# Patient Record
Sex: Female | Born: 1980 | Race: Black or African American | Hispanic: No | Marital: Single | Smoking: Current every day smoker
Health system: Southern US, Community
[De-identification: ages and names within clinical notes are randomized; demographics above are authoritative.]

## PROBLEM LIST (undated history)

## (undated) DIAGNOSIS — J45909 Unspecified asthma, uncomplicated: Secondary | ICD-10-CM

## (undated) DIAGNOSIS — F4310 Post-traumatic stress disorder, unspecified: Secondary | ICD-10-CM

## (undated) DIAGNOSIS — D496 Neoplasm of unspecified behavior of brain: Secondary | ICD-10-CM

## (undated) DIAGNOSIS — F259 Schizoaffective disorder, unspecified: Secondary | ICD-10-CM

## (undated) DIAGNOSIS — R569 Unspecified convulsions: Secondary | ICD-10-CM

## (undated) DIAGNOSIS — F319 Bipolar disorder, unspecified: Secondary | ICD-10-CM

---

## 2001-08-19 ENCOUNTER — Inpatient Hospital Stay (HOSPITAL_COMMUNITY): Admission: EM | Admit: 2001-08-19 | Discharge: 2001-08-26 | Payer: Self-pay | Admitting: Psychiatry

## 2004-04-01 ENCOUNTER — Emergency Department: Payer: Self-pay | Admitting: Emergency Medicine

## 2004-06-23 ENCOUNTER — Observation Stay: Payer: Self-pay | Admitting: Certified Nurse Midwife

## 2004-08-22 ENCOUNTER — Observation Stay: Payer: Self-pay

## 2004-09-23 ENCOUNTER — Emergency Department: Payer: Self-pay | Admitting: Emergency Medicine

## 2004-09-28 ENCOUNTER — Observation Stay: Payer: Self-pay

## 2004-11-15 ENCOUNTER — Inpatient Hospital Stay: Payer: Self-pay | Admitting: Obstetrics and Gynecology

## 2005-12-12 ENCOUNTER — Ambulatory Visit: Payer: Self-pay | Admitting: Gynecology

## 2005-12-12 ENCOUNTER — Inpatient Hospital Stay (HOSPITAL_COMMUNITY): Admission: AD | Admit: 2005-12-12 | Discharge: 2005-12-14 | Payer: Self-pay | Admitting: Gynecology

## 2005-12-27 ENCOUNTER — Inpatient Hospital Stay (HOSPITAL_COMMUNITY): Admission: AD | Admit: 2005-12-27 | Discharge: 2005-12-28 | Payer: Self-pay | Admitting: Gynecology

## 2005-12-27 ENCOUNTER — Ambulatory Visit: Payer: Self-pay | Admitting: Gynecology

## 2006-01-21 ENCOUNTER — Inpatient Hospital Stay: Payer: Self-pay | Admitting: Obstetrics and Gynecology

## 2006-04-24 ENCOUNTER — Emergency Department: Payer: Self-pay | Admitting: Emergency Medicine

## 2006-12-31 ENCOUNTER — Observation Stay: Payer: Self-pay | Admitting: Obstetrics and Gynecology

## 2007-01-08 ENCOUNTER — Ambulatory Visit: Payer: Self-pay | Admitting: Obstetrics and Gynecology

## 2007-01-09 ENCOUNTER — Inpatient Hospital Stay: Payer: Self-pay | Admitting: Obstetrics and Gynecology

## 2008-12-17 ENCOUNTER — Emergency Department: Payer: Self-pay | Admitting: Emergency Medicine

## 2009-07-11 ENCOUNTER — Emergency Department: Payer: Self-pay | Admitting: Emergency Medicine

## 2014-05-09 ENCOUNTER — Emergency Department (HOSPITAL_COMMUNITY)
Admission: EM | Admit: 2014-05-09 | Discharge: 2014-05-09 | Disposition: A | Discharge status: Home / Self Care | Payer: Self-pay | Attending: Emergency Medicine | Admitting: Emergency Medicine

## 2014-05-09 ENCOUNTER — Encounter (HOSPITAL_COMMUNITY): Payer: Self-pay | Admitting: Emergency Medicine

## 2014-05-09 ENCOUNTER — Emergency Department (HOSPITAL_COMMUNITY): Payer: Self-pay

## 2014-05-09 DIAGNOSIS — R519 Headache, unspecified: Secondary | ICD-10-CM

## 2014-05-09 DIAGNOSIS — Z720 Tobacco use: Secondary | ICD-10-CM | POA: Insufficient documentation

## 2014-05-09 DIAGNOSIS — Z8659 Personal history of other mental and behavioral disorders: Secondary | ICD-10-CM | POA: Insufficient documentation

## 2014-05-09 DIAGNOSIS — J45909 Unspecified asthma, uncomplicated: Secondary | ICD-10-CM | POA: Insufficient documentation

## 2014-05-09 DIAGNOSIS — G40909 Epilepsy, unspecified, not intractable, without status epilepticus: Secondary | ICD-10-CM | POA: Insufficient documentation

## 2014-05-09 DIAGNOSIS — Z3202 Encounter for pregnancy test, result negative: Secondary | ICD-10-CM | POA: Insufficient documentation

## 2014-05-09 DIAGNOSIS — Z86011 Personal history of benign neoplasm of the brain: Secondary | ICD-10-CM | POA: Insufficient documentation

## 2014-05-09 DIAGNOSIS — R569 Unspecified convulsions: Secondary | ICD-10-CM

## 2014-05-09 DIAGNOSIS — R51 Headache: Secondary | ICD-10-CM | POA: Insufficient documentation

## 2014-05-09 DIAGNOSIS — A599 Trichomoniasis, unspecified: Secondary | ICD-10-CM | POA: Insufficient documentation

## 2014-05-09 HISTORY — DX: Unspecified asthma, uncomplicated: J45.909

## 2014-05-09 HISTORY — DX: Post-traumatic stress disorder, unspecified: F43.10

## 2014-05-09 HISTORY — DX: Neoplasm of unspecified behavior of brain: D49.6

## 2014-05-09 HISTORY — DX: Bipolar disorder, unspecified: F31.9

## 2014-05-09 HISTORY — DX: Schizoaffective disorder, unspecified: F25.9

## 2014-05-09 HISTORY — DX: Unspecified convulsions: R56.9

## 2014-05-09 LAB — CBC WITH DIFFERENTIAL/PLATELET
BASOS ABS: 0 10*3/uL (ref 0.0–0.1)
BASOS PCT: 1 % (ref 0–1)
EOS PCT: 3 % (ref 0–5)
Eosinophils Absolute: 0.2 10*3/uL (ref 0.0–0.7)
HCT: 39.6 % (ref 36.0–46.0)
HEMOGLOBIN: 13.5 g/dL (ref 12.0–15.0)
LYMPHS ABS: 2.7 10*3/uL (ref 0.7–4.0)
LYMPHS PCT: 45 % (ref 12–46)
MCH: 30.6 pg (ref 26.0–34.0)
MCHC: 34.1 g/dL (ref 30.0–36.0)
MCV: 89.8 fL (ref 78.0–100.0)
MONO ABS: 0.5 10*3/uL (ref 0.1–1.0)
Monocytes Relative: 9 % (ref 3–12)
NEUTROS ABS: 2.5 10*3/uL (ref 1.7–7.7)
Neutrophils Relative %: 42 % — ABNORMAL LOW (ref 43–77)
Platelets: 422 10*3/uL — ABNORMAL HIGH (ref 150–400)
RBC: 4.41 MIL/uL (ref 3.87–5.11)
RDW: 13.4 % (ref 11.5–15.5)
WBC: 5.9 10*3/uL (ref 4.0–10.5)

## 2014-05-09 LAB — CBG MONITORING, ED
Glucose-Capillary: 66 mg/dL — ABNORMAL LOW (ref 70–99)
Glucose-Capillary: 72 mg/dL (ref 70–99)

## 2014-05-09 LAB — URINALYSIS, ROUTINE W REFLEX MICROSCOPIC
BILIRUBIN URINE: NEGATIVE
GLUCOSE, UA: NEGATIVE mg/dL
Hgb urine dipstick: NEGATIVE
KETONES UR: NEGATIVE mg/dL
Nitrite: NEGATIVE
PH: 6.5 (ref 5.0–8.0)
PROTEIN: NEGATIVE mg/dL
SPECIFIC GRAVITY, URINE: 1.008 (ref 1.005–1.030)
UROBILINOGEN UA: 1 mg/dL (ref 0.0–1.0)

## 2014-05-09 LAB — BASIC METABOLIC PANEL
ANION GAP: 13 (ref 5–15)
BUN: 7 mg/dL (ref 6–23)
CO2: 22 mEq/L (ref 19–32)
Calcium: 9.5 mg/dL (ref 8.4–10.5)
Chloride: 101 mEq/L (ref 96–112)
Creatinine, Ser: 0.65 mg/dL (ref 0.50–1.10)
GFR calc Af Amer: >90 mL/min (ref >90)
Glucose, Bld: 81 mg/dL (ref 70–99)
Potassium: 4.1 mEq/L (ref 3.7–5.3)
Sodium: 136 mEq/L — ABNORMAL LOW (ref 137–147)

## 2014-05-09 LAB — URINE MICROSCOPIC-ADD ON

## 2014-05-09 LAB — POC URINE PREG, ED: PREG TEST UR: NEGATIVE

## 2014-05-09 MED ORDER — ONDANSETRON HCL 4 MG/2ML IJ SOLN
4.0000 mg | Freq: Once | INTRAMUSCULAR | Status: AC
  Administered 2014-05-09: 4 mg via INTRAVENOUS
  Filled 2014-05-09: qty 2

## 2014-05-09 MED ORDER — DIPHENHYDRAMINE HCL 50 MG/ML IJ SOLN
25.0000 mg | Freq: Once | INTRAMUSCULAR | Status: AC
  Administered 2014-05-09: 25 mg via INTRAVENOUS
  Filled 2014-05-09: qty 1

## 2014-05-09 MED ORDER — METOCLOPRAMIDE HCL 5 MG/ML IJ SOLN
10.0000 mg | Freq: Once | INTRAMUSCULAR | Status: AC
  Administered 2014-05-09: 10 mg via INTRAVENOUS
  Filled 2014-05-09: qty 2

## 2014-05-09 MED ORDER — LEVETIRACETAM 1000 MG PO TABS: 1000.0000 mg | ORAL_TABLET | Freq: Two times a day (BID) | ORAL | Status: AC

## 2014-05-09 MED ORDER — METRONIDAZOLE 500 MG PO TABS
2000.0000 mg | ORAL_TABLET | Freq: Once | ORAL | Status: AC
  Administered 2014-05-09: 2000 mg via ORAL
  Filled 2014-05-09: qty 4

## 2014-05-09 MED ORDER — KETOROLAC TROMETHAMINE 30 MG/ML IJ SOLN
30.0000 mg | Freq: Once | INTRAMUSCULAR | Status: AC
  Administered 2014-05-09: 30 mg via INTRAVENOUS
  Filled 2014-05-09: qty 1

## 2014-05-09 MED ORDER — LEVETIRACETAM IN NACL 1000 MG/100ML IV SOLN
1000.0000 mg | Freq: Once | INTRAVENOUS | Status: AC
  Administered 2014-05-09: 1000 mg via INTRAVENOUS
  Filled 2014-05-09: qty 100

## 2014-05-09 MED ORDER — SODIUM CHLORIDE 0.9 % IV BOLUS (SEPSIS)
1000.0000 mL | Freq: Once | INTRAVENOUS | Status: AC
  Administered 2014-05-09: 1000 mL via INTRAVENOUS
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
## 2014-05-09 NOTE — ED Notes (Signed)
Pt from home via [facility] c/o headache since yesterday. Witnessd seizure this am lasting about 1 min. HX of brain tumor last chemo treatment for tumor was in 2013. Pt alert oriented. Light sensitivity present.

## 2014-05-09 NOTE — ED Notes (Signed)
Patient transported to CT 

## 2014-05-09 NOTE — ED Notes (Signed)
MD at bedside. 

## 2014-05-09 NOTE — ED Notes (Signed)
Bed: WA25 Expected date:  Expected time:  Means of arrival:  Comments: EMS seizure 

## 2014-05-09 NOTE — Discharge Instructions (Signed)
Seizure, Adult A seizure is abnormal electrical activity in the brain. Seizures usually last from 30 seconds to 2 minutes. There are various types of seizures. Before a seizure, you may have a warning sensation (aura) that a seizure is about to occur. An aura may include the following symptoms:   Fear or anxiety.  Nausea.  Feeling like the room is spinning (vertigo).  Vision changes, such as seeing flashing lights or spots. Common symptoms during a seizure include:  A change in attention or behavior (altered mental status).  Convulsions with rhythmic jerking movements.  Drooling.  Rapid eye movements.  Grunting.  Loss of bladder and bowel control.  Bitter taste in the mouth.  Tongue biting. After a seizure, you may feel confused and sleepy. You may also have an injury resulting from convulsions during the seizure. HOME CARE INSTRUCTIONS   If you are given medicines, take them exactly as prescribed by your health care provider.  Keep all follow-up appointments as directed by your health care provider.  Do not swim or drive or engage in risky activity during which a seizure could cause further injury to you or others until your health care provider says it is OK.  Get adequate rest.  Teach friends and family what to do if you have a seizure. They should:  Lay you on the ground to prevent a fall.  Put a cushion under your head.  Loosen any tight clothing around your neck.  Turn you on your side. If vomiting occurs, this helps keep your airway clear.  Stay with you until you recover.  Know whether or not you need emergency care. SEEK IMMEDIATE MEDICAL CARE IF:  The seizure lasts longer than 5 minutes.  The seizure is severe or you do not wake up immediately after the seizure.  You have an altered mental status after the seizure.  You are having more frequent or worsening seizures. Someone should drive you to the emergency department or call local emergency  services (911 in [place] MAKE SURE YOU:  Understand these instructions.  Will watch your condition.  Will get help right away if you are not doing well or get worse. Document Released: 05/09/2000 Document Revised: 03/02/2013 Document Reviewed: 12/22/2012 [facility] Patient Information 2015 [place], [place]. This information is not intended to replace advice given to you by your health care provider. Make sure you discuss any questions you have with your health care provider.   Trichomoniasis Trichomoniasis is an infection caused by an organism called Trichomonas. The infection can affect both women and men. In women, the outer female genitalia and the vagina are affected. In men, the penis is mainly affected, but the prostate and other reproductive organs can also be involved. Trichomoniasis is a sexually transmitted infection (STI) and is most often passed to another person through sexual contact.  RISK FACTORS  Having unprotected sexual intercourse.  Having sexual intercourse with an infected partner. SIGNS AND SYMPTOMS  Symptoms of trichomoniasis in women include:  Abnormal gray-green frothy vaginal discharge.  Itching and irritation of the vagina.  Itching and irritation of the area outside the vagina. Symptoms of trichomoniasis in men include:   Penile discharge with or without pain.  Pain during urination. This results from inflammation of the urethra. DIAGNOSIS  Trichomoniasis may be found during a Pap test or physical exam. Your health care provider may use one of the following methods to help diagnose this infection:  Examining vaginal discharge under a microscope. For men, urethral discharge would be  examined.  Testing the pH of the vagina with a test tape.  Using a vaginal swab test that checks for the Trichomonas organism. A test is available that provides results within a few minutes.  Doing a culture test for the organism. This is not usually needed. TREATMENT    You may be given medicine to fight the infection. Women should inform their health care provider if they could be or are pregnant. Some medicines used to treat the infection should not be taken during pregnancy.  Your health care provider may recommend over-the-counter medicines or creams to decrease itching or irritation.  Your sexual partner will need to be treated if infected. HOME CARE INSTRUCTIONS   Take medicines only as directed by your health care provider.  Take over-the-counter medicine for itching or irritation as directed by your health care provider.  Do not have sexual intercourse while you have the infection.  Women should not douche or wear tampons while they have the infection.  Discuss your infection with your partner. Your partner may have gotten the infection from you, or you may have gotten it from your partner.  Have your sex partner get examined and treated if necessary.  Practice safe, informed, and protected sex.  See your health care provider for other STI testing. SEEK MEDICAL CARE IF:   You still have symptoms after you finish your medicine.  You develop abdominal pain.  You have pain when you urinate.  You have bleeding after sexual intercourse.  You develop a rash.  Your medicine makes you sick or makes you throw up (vomit). MAKE SURE YOU:  Understand these instructions.  Will watch your condition.  Will get help right away if you are not doing well or get worse. Document Released: 11/05/2000 Document Revised: 09/26/2013 Document Reviewed: 02/21/2013 [facility] Patient Information 2015 [place], [place]. This information is not intended to replace advice given to you by your health care provider. Make sure you discuss any questions you have with your health care provider.   General Headache Without Cause A headache is pain or discomfort felt around the head or neck area. The specific cause of a headache may not be found. There are many  causes and types of headaches. A few common ones are:  Tension headaches.  Migraine headaches.  Cluster headaches.  Chronic daily headaches. HOME CARE INSTRUCTIONS   Keep all follow-up appointments with your caregiver or any specialist referral.  Only take over-the-counter or prescription medicines for pain or discomfort as directed by your caregiver.  Lie down in a dark, quiet room when you have a headache.  Keep a headache journal to find out what may trigger your migraine headaches. For example, write down:  What you eat and drink.  How much sleep you get.  Any change to your diet or medicines.  Try massage or other relaxation techniques.  Put ice packs or heat on the head and neck. Use these 3 to 4 times per day for 15 to 20 minutes each time, or as needed.  Limit stress.  Sit up straight, and do not tense your muscles.  Quit smoking if you smoke.  Limit alcohol use.  Decrease the amount of caffeine you drink, or stop drinking caffeine.  Eat and sleep on a regular schedule.  Get 7 to 9 hours of sleep, or as recommended by your caregiver.  Keep lights dim if bright lights bother you and make your headaches worse. SEEK MEDICAL CARE IF:   You have problems with  the medicines you were prescribed.  Your medicines are not working.  You have a change from the usual headache.  You have nausea or vomiting. SEEK IMMEDIATE MEDICAL CARE IF:   Your headache becomes severe.  You have a fever.  You have a stiff neck.  You have loss of vision.  You have muscular weakness or loss of muscle control.  You start losing your balance or have trouble walking.  You feel faint or pass out.  You have severe symptoms that are different from your first symptoms. MAKE SURE YOU:   Understand these instructions.  Will watch your condition.  Will get help right away if you are not doing well or get worse. Document Released: 05/12/2005 Document Revised: 08/04/2011  Document Reviewed: 05/28/2011 [facility] Patient Information 2015 [place], [place]. This information is not intended to replace advice given to you by your health care provider. Make sure you discuss any questions you have with your health care provider.

## 2014-05-09 NOTE — ED Notes (Signed)
CSW at bedside to discuss resources.

## 2014-05-09 NOTE — ED Notes (Signed)
Initial contact-pt A&Ox4. Reports she has been taking her mother's medication-Keppra 750 mg BID. Was prescribed 1000 mg BID in the past. Unable to get prescription d/t moving from [place] and not having Medicaid at the time. Denies blurry vision at the moment. MD Regenia Skeeter at the bedside.

## 2014-05-11 LAB — GC/CHLAMYDIA PROBE AMP
CT Probe RNA: NEGATIVE
GC Probe RNA: NEGATIVE

## 2015-01-02 ENCOUNTER — Emergency Department (HOSPITAL_COMMUNITY)
Admission: EM | Admit: 2015-01-02 | Discharge: 2015-01-02 | Disposition: A | Discharge status: Home / Self Care | Payer: Self-pay | Attending: Emergency Medicine | Admitting: Emergency Medicine

## 2015-01-02 ENCOUNTER — Encounter (HOSPITAL_COMMUNITY): Payer: Self-pay

## 2015-01-02 DIAGNOSIS — Z79899 Other long term (current) drug therapy: Secondary | ICD-10-CM | POA: Insufficient documentation

## 2015-01-02 DIAGNOSIS — Y998 Other external cause status: Secondary | ICD-10-CM | POA: Insufficient documentation

## 2015-01-02 DIAGNOSIS — T63441A Toxic effect of venom of bees, accidental (unintentional), initial encounter: Secondary | ICD-10-CM

## 2015-01-02 DIAGNOSIS — Z85841 Personal history of malignant neoplasm of brain: Secondary | ICD-10-CM | POA: Insufficient documentation

## 2015-01-02 DIAGNOSIS — J45909 Unspecified asthma, uncomplicated: Secondary | ICD-10-CM | POA: Insufficient documentation

## 2015-01-02 DIAGNOSIS — Y9289 Other specified places as the place of occurrence of the external cause: Secondary | ICD-10-CM | POA: Insufficient documentation

## 2015-01-02 DIAGNOSIS — Y9389 Activity, other specified: Secondary | ICD-10-CM | POA: Insufficient documentation

## 2015-01-02 DIAGNOSIS — F319 Bipolar disorder, unspecified: Secondary | ICD-10-CM | POA: Insufficient documentation

## 2015-01-02 DIAGNOSIS — T63421A Toxic effect of venom of ants, accidental (unintentional), initial encounter: Secondary | ICD-10-CM | POA: Insufficient documentation

## 2015-01-02 DIAGNOSIS — G40909 Epilepsy, unspecified, not intractable, without status epilepticus: Secondary | ICD-10-CM | POA: Insufficient documentation

## 2015-01-02 DIAGNOSIS — Z720 Tobacco use: Secondary | ICD-10-CM | POA: Insufficient documentation

## 2015-01-02 DIAGNOSIS — R200 Anesthesia of skin: Secondary | ICD-10-CM | POA: Insufficient documentation

## 2015-01-02 MED ORDER — DIPHENHYDRAMINE HCL 25 MG PO CAPS
25.0000 mg | ORAL_CAPSULE | Freq: Once | ORAL | Status: AC
  Administered 2015-01-02: 25 mg via ORAL
  Filled 2015-01-02: qty 1

## 2015-01-02 MED ORDER — FAMOTIDINE 20 MG PO TABS
20.0000 mg | ORAL_TABLET | Freq: Once | ORAL | Status: AC
  Administered 2015-01-02: 20 mg via ORAL
  Filled 2015-01-02: qty 1

## 2015-01-02 MED ORDER — PREDNISONE 20 MG PO TABS
60.0000 mg | ORAL_TABLET | Freq: Once | ORAL | Status: AC
  Administered 2015-01-02: 60 mg via ORAL
  Filled 2015-01-02: qty 3

## 2015-01-02 MED ORDER — PREDNISONE 20 MG PO TABS: 20.0000 mg | ORAL_TABLET | Freq: Two times a day (BID) | ORAL | Status: AC

## 2015-01-02 NOTE — ED Notes (Signed)
Per EMS, Pt, from home, c/o L arm pain/numbness and L leg pain x 3 days after being stung by a bee.  Pain score 9/10.  Pt reports being stung on posterior L arm.  Pt has taken Tylenol w/o relief.  Hx of chronic L shoulder and L hip pain.

## 2015-01-02 NOTE — ED Provider Notes (Signed)
CSN: [id number]     Arrival date & time 01/02/15  1714 History   First MD Initiated Contact with Patient 01/02/15 1817     Chief Complaint  Patient presents with  . Arm Pain  . Numbness  . Leg Pain     (Consider location/radiation/quality/duration/timing/severity/associated sxs/prior Treatment) HPI\  Stacy May is a 34 y.o. female who presents for evaluation of left arm swelling after being stung by a bee. This is causing persistent pain. The sting was 3 days ago. The pain keeps her up at night. She denies shortness of breath, cough, wheezing, fever, chills, nausea or vomiting. There is no weakness. There are no other known modifying factors.   Past Medical History  Diagnosis Date  . Brain tumor   . Seizures   . PTSD (post-traumatic stress disorder)   . Bipolar 1 disorder   . Schizoaffective disorder   . Asthma    History reviewed. No pertinent past surgical history. History reviewed. No pertinent family history. Social History  Substance Use Topics  . Smoking status: Current Every Day Smoker  . Smokeless tobacco: None  . Alcohol Use: No   OB History    No data available     Review of Systems  All other systems reviewed and are negative.     Allergies  Blueberry flavor; Pineapple; and Raspberry  Home Medications   Prior to Admission medications   Medication Sig Start Date End Date Taking? Authorizing Provider  albuterol (PROVENTIL HFA;VENTOLIN HFA) 108 (90 BASE) MCG/ACT inhaler Inhale 2 puffs into the lungs every 6 (six) hours as needed for wheezing or shortness of breath.   Yes Historical Provider, MD  diphenhydramine-acetaminophen (TYLENOL PM) 25-500 MG TABS Take 1 tablet by mouth at bedtime as needed (sleep).   Yes Historical Provider, MD  levETIRAcetam (KEPPRA) 1000 MG tablet Take 1 tablet (1,000 mg total) by mouth 2 (two) times daily. 05/09/14  Yes Sherwood Gambler, MD  predniSONE (DELTASONE) 20 MG tablet Take 1 tablet (20 mg total) by mouth 2 (two) times  daily. 01/02/15   Daleen Bo, MD   BP 109/72 mmHg  Pulse 61  Temp(Src) 98.4 F (36.9 C) (Oral)  Resp 16  SpO2 100% Physical Exam  Constitutional: She is oriented to person, place, and time. She appears well-developed and well-nourished. No distress.  HENT:  Head: Normocephalic and atraumatic.  Right Ear: External ear normal.  Left Ear: External ear normal.  Eyes: Conjunctivae and EOM are normal. Pupils are equal, round, and reactive to light.  Neck: Normal range of motion and phonation normal. Neck supple.  Cardiovascular: Normal rate, regular rhythm and normal heart sounds.   Pulmonary/Chest: Effort normal and breath sounds normal. She exhibits no bony tenderness.  Abdominal: Soft. There is no tenderness.  Musculoskeletal: Normal range of motion.  Left arm, with mild swelling in the lower trapezius area. This area is edematous, boggy and tender to touch. There is normal range of motion of left shoulder, left elbow and left wrist.  Neurological: She is alert and oriented to person, place, and time. No cranial nerve deficit or sensory deficit. She exhibits normal muscle tone. Coordination normal.  Skin: Skin is warm, dry and intact.  Psychiatric: She has a normal mood and affect. Her behavior is normal. Judgment and thought content normal.  Nursing note and vitals reviewed.   ED Course  Procedures (including critical care time)  Medications  predniSONE (DELTASONE) tablet 60 mg (60 mg Oral Given 01/02/15 1928)  diphenhydrAMINE (BENADRYL) capsule  25 mg (25 mg Oral Given 01/02/15 1928)  famotidine (PEPCID) tablet 20 mg (20 mg Oral Given 01/02/15 1928)    Patient Vitals for the past 24 hrs:  BP Temp Temp src Pulse Resp SpO2  01/02/15 1917 109/72 mmHg - - 61 16 100 %  01/02/15 1718 115/72 mmHg 98.4 F (36.9 C) Oral 60 16 100 %    Findings discussed with the patient, all questions were answered.   Labs Review Labs Reviewed - No data to display  Imaging Review No results  found.   EKG Interpretation None      MDM   Final diagnoses:  Bee sting reaction, accidental or unintentional, initial encounter    Be sting with local allergic rxn.  Nursing Notes Reviewed/ Care Coordinated Applicable Imaging Reviewed Interpretation of Laboratory Data incorporated into ED treatment  The patient appears reasonably screened and/or stabilized for discharge and I doubt any other medical condition or other [facility] requiring further screening, evaluation, or treatment in the ED at this time prior to discharge.  Plan: Home Medications- Prednisone, H1/H2 blockade; Home Treatments- rest; return here if the recommended treatment, does not improve the symptoms; Recommended follow up- PCP prn     Daleen Bo, MD 01/03/15 1329

## 2015-01-02 NOTE — Discharge Instructions (Signed)
Use heat on the sore area 3 or 4 times a day. Elevate the left arm above your heart, as much as possible. The prednisone should help your pain. If you need additional pain control, use Tylenol every 4 hours. Also take Benadryl 25 mg 4 times a day, and Pepcid 20 mg twice a day until the swelling gets better.   Bee, Wasp, or Hornet Sting Your caregiver has diagnosed you as having an insect sting. An insect sting appears as a red lump in the skin that sometimes has a tiny hole in the center, or it may have a stinger in the center of the wound. The most common stings are from wasps, hornets and bees. Individuals have different reactions to insect stings.  A normal reaction may cause pain, swelling, and redness around the sting site.  A localized allergic reaction may cause swelling and redness that extends beyond the sting site.  A large local reaction may continue to develop over the next 12 to 36 hours.  On occasion, the reactions can be severe (anaphylactic reaction). An anaphylactic reaction may cause wheezing; difficulty breathing; chest pain; fainting; raised, itchy, red patches on the skin; a sick feeling to your stomach (nausea); vomiting; cramping; or diarrhea. If you have had an anaphylactic reaction to an insect sting in the past, you are more likely to have one again. HOME CARE INSTRUCTIONS   With bee stings, a small sac of poison is left in the wound. Brushing across this with something such as a credit card, or anything similar, will help remove this and decrease the amount of the reaction. This same procedure will not help a wasp sting as they do not leave behind a stinger and poison sac.  Apply a cold compress for 10 to 20 minutes every hour for 1 to 2 days, depending on severity, to reduce swelling and itching.  To lessen pain, a paste made of water and baking soda may be rubbed on the bite or sting and left on for 5 minutes.  To relieve itching and swelling, you may use take  medication or apply medicated creams or lotions as directed.  Only take over-the-counter or prescription medicines for pain, discomfort, or fever as directed by your caregiver.  Wash the sting site daily with soap and water. Apply antibiotic ointment on the sting site as directed.  If you suffered a severe reaction:  If you did not require hospitalization, an adult will need to stay with you for 24 hours in case the symptoms return.  You may need to wear a medical bracelet or necklace stating the allergy.  You and your family need to learn when and how to use an anaphylaxis kit or epinephrine injection.  If you have had a severe reaction before, always carry your anaphylaxis kit with you. SEEK MEDICAL CARE IF:   None of the above helps within 2 to 3 days.  The area becomes red, warm, tender, and swollen beyond the area of the bite or sting.  You have an oral temperature above 102 F (38.9 C). SEEK IMMEDIATE MEDICAL CARE IF:  You have symptoms of an allergic reaction which are:  Wheezing.  Difficulty breathing.  Chest pain.  Lightheadedness or fainting.  Itchy, raised, red patches on the skin.  Nausea, vomiting, cramping or diarrhea. ANY OF THESE SYMPTOMS MAY REPRESENT A SERIOUS PROBLEM THAT IS AN EMERGENCY. Do not wait to see if the symptoms will go away. Get medical help right away. Call your local emergency  services (911 in [place] DO NOT drive yourself to the hospital. MAKE SURE YOU:   Understand these instructions.  Will watch your condition.  Will get help right away if you are not doing well or get worse. Document Released: 05/12/2005 Document Revised: 08/04/2011 Document Reviewed: 10/27/2009 [facility] Patient Information 2015 [place], [place]. This information is not intended to replace advice given to you by your health care provider. Make sure you discuss any questions you have with your health care provider.

## 2015-02-15 ENCOUNTER — Emergency Department (HOSPITAL_COMMUNITY)
Admission: EM | Admit: 2015-02-15 | Discharge: 2015-02-15 | Disposition: A | Discharge status: Home / Self Care | Payer: Self-pay | Attending: Emergency Medicine | Admitting: Emergency Medicine

## 2015-02-15 ENCOUNTER — Encounter (HOSPITAL_COMMUNITY): Payer: Self-pay | Admitting: *Deleted

## 2015-02-15 DIAGNOSIS — Z8659 Personal history of other mental and behavioral disorders: Secondary | ICD-10-CM | POA: Insufficient documentation

## 2015-02-15 DIAGNOSIS — Y998 Other external cause status: Secondary | ICD-10-CM | POA: Insufficient documentation

## 2015-02-15 DIAGNOSIS — Y9289 Other specified places as the place of occurrence of the external cause: Secondary | ICD-10-CM | POA: Insufficient documentation

## 2015-02-15 DIAGNOSIS — J45909 Unspecified asthma, uncomplicated: Secondary | ICD-10-CM | POA: Insufficient documentation

## 2015-02-15 DIAGNOSIS — W57XXXA Bitten or stung by nonvenomous insect and other nonvenomous arthropods, initial encounter: Secondary | ICD-10-CM | POA: Insufficient documentation

## 2015-02-15 DIAGNOSIS — S70361A Insect bite (nonvenomous), right thigh, initial encounter: Secondary | ICD-10-CM | POA: Insufficient documentation

## 2015-02-15 DIAGNOSIS — Z86018 Personal history of other benign neoplasm: Secondary | ICD-10-CM | POA: Insufficient documentation

## 2015-02-15 DIAGNOSIS — N898 Other specified noninflammatory disorders of vagina: Secondary | ICD-10-CM

## 2015-02-15 DIAGNOSIS — Z720 Tobacco use: Secondary | ICD-10-CM | POA: Insufficient documentation

## 2015-02-15 DIAGNOSIS — L0889 Other specified local infections of the skin and subcutaneous tissue: Secondary | ICD-10-CM | POA: Insufficient documentation

## 2015-02-15 DIAGNOSIS — L089 Local infection of the skin and subcutaneous tissue, unspecified: Secondary | ICD-10-CM

## 2015-02-15 DIAGNOSIS — Y9389 Activity, other specified: Secondary | ICD-10-CM | POA: Insufficient documentation

## 2015-02-15 DIAGNOSIS — Z79899 Other long term (current) drug therapy: Secondary | ICD-10-CM | POA: Insufficient documentation

## 2015-02-15 DIAGNOSIS — G40909 Epilepsy, unspecified, not intractable, without status epilepticus: Secondary | ICD-10-CM | POA: Insufficient documentation

## 2015-02-15 DIAGNOSIS — Z3202 Encounter for pregnancy test, result negative: Secondary | ICD-10-CM | POA: Insufficient documentation

## 2015-02-15 LAB — URINALYSIS, ROUTINE W REFLEX MICROSCOPIC
Bilirubin Urine: NEGATIVE
GLUCOSE, UA: NEGATIVE mg/dL
HGB URINE DIPSTICK: NEGATIVE
KETONES UR: NEGATIVE mg/dL
Nitrite: NEGATIVE
PROTEIN: NEGATIVE mg/dL
Specific Gravity, Urine: 1.027 (ref 1.005–1.030)
Urobilinogen, UA: 1 mg/dL (ref 0.0–1.0)
pH: 6.5 (ref 5.0–8.0)

## 2015-02-15 LAB — POC URINE PREG, ED: PREG TEST UR: NEGATIVE

## 2015-02-15 LAB — URINE MICROSCOPIC-ADD ON

## 2015-02-15 LAB — WET PREP, GENITAL
TRICH WET PREP: NONE SEEN
YEAST WET PREP: NONE SEEN

## 2015-02-15 MED ORDER — DOXYCYCLINE HYCLATE 100 MG PO CAPS: 100.0000 mg | ORAL_CAPSULE | Freq: Two times a day (BID) | ORAL | Status: AC

## 2015-02-15 MED ORDER — DOXYCYCLINE HYCLATE 100 MG PO TABS
100.0000 mg | ORAL_TABLET | Freq: Once | ORAL | Status: AC
  Administered 2015-02-15: 100 mg via ORAL
  Filled 2015-02-15: qty 1

## 2015-02-15 NOTE — Discharge Instructions (Signed)
Take antibiotic for treatment of skin infection and suspect urinary tract infection.  Return to ER in 2 days if you notice no improvement of symptoms.  Cellulitis Cellulitis is an infection of the skin and the tissue beneath it. The infected area is usually red and tender. Cellulitis occurs most often in the arms and lower legs.  CAUSES  Cellulitis is caused by bacteria that enter the skin through cracks or cuts in the skin. The most common types of bacteria that cause cellulitis are staphylococci and streptococci. SIGNS AND SYMPTOMS   Redness and warmth.  Swelling.  Tenderness or pain.  Fever. DIAGNOSIS  Your health care provider can usually determine what is wrong based on a physical exam. Blood tests may also be done. TREATMENT  Treatment usually involves taking an antibiotic medicine. HOME CARE INSTRUCTIONS   Take your antibiotic medicine as directed by your health care provider. Finish the antibiotic even if you start to feel better.  Keep the infected arm or leg elevated to reduce swelling.  Apply a warm cloth to the affected area up to 4 times per day to relieve pain.  Take medicines only as directed by your health care provider.  Keep all follow-up visits as directed by your health care provider. SEEK MEDICAL CARE IF:   You notice red streaks coming from the infected area.  Your red area gets larger or turns dark in color.  Your bone or joint underneath the infected area becomes painful after the skin has healed.  Your infection returns in the same area or another area.  You notice a swollen bump in the infected area.  You develop new symptoms.  You have a fever. SEEK IMMEDIATE MEDICAL CARE IF:   You feel very sleepy.  You develop vomiting or diarrhea.  You have a general ill feeling (malaise) with muscle aches and pains. MAKE SURE YOU:   Understand these instructions.  Will watch your condition.  Will get help right away if you are not doing well or  get worse. Document Released: 02/19/2005 Document Revised: 09/26/2013 Document Reviewed: 07/28/2011 [facility] Patient Information 2015 [place], [place]. This information is not intended to replace advice given to you by your health care provider. Make sure you discuss any questions you have with your health care provider.

## 2015-02-15 NOTE — ED Notes (Signed)
PA at bedside.

## 2015-02-15 NOTE — ED Provider Notes (Signed)
CSN: [id number]     Arrival date & time 02/15/15  1523 History  This chart was scribed for non-physician practitioner Domenic Moras, PA, working with Forde Dandy, MD, by [name], ED Scribe. This patient was seen in room TR11C/TR11C and the patient's care was started at 4:51 PM.  Chief Complaint  Patient presents with  . Vaginal Discharge  . Abscess   The history is provided by the patient. No language interpreter was used.     HPI Comments: Stacy May is a 34 y.o. female who presents to the Emergency Department complaining of intermittent lower abdominal pain radiating to left flank and malodorous vaginal discharge x 3-4 days. Pt also notes dark colored urine. The pain is exacerbated with movement. Pt mentions recent change in body wash approximately 2 days ago. Pt also complains of insect bite to right upper thigh x 2 days that has since formed into an abscess. Pt tried to pop the area, causing increased redness and pain to the area. Denies dysuria, fever, chills, nausea, vomiting, or any other associated symptoms. Hx trichomoniasis. Pt reports having 2 partners within the last 6 months but states she has not used protection regularly with her partner for the past 5 months. G9P4. LNMP: 01/27/2015. She has not eaten any asparagus lately.    Past Medical History  Diagnosis Date  . Brain tumor   . Seizures   . PTSD (post-traumatic stress disorder)   . Bipolar 1 disorder   . Schizoaffective disorder   . Asthma    History reviewed. No pertinent past surgical history. History reviewed. No pertinent family history. Social History  Substance Use Topics  . Smoking status: Current Every Day Smoker  . Smokeless tobacco: None  . Alcohol Use: No   OB History    No data available     Review of Systems  Constitutional: Negative for fever and chills.  Gastrointestinal: Positive for abdominal pain. Negative for nausea and vomiting.  Genitourinary: Positive for flank pain (Left) and  vaginal discharge. Negative for dysuria.  Skin:       Abscess to right thigh   Allergies  Blueberry flavor; Pineapple; and Raspberry  Home Medications   Prior to Admission medications   Medication Sig Start Date End Date Taking? Authorizing Provider  albuterol (PROVENTIL HFA;VENTOLIN HFA) 108 (90 BASE) MCG/ACT inhaler Inhale 2 puffs into the lungs every 6 (six) hours as needed for wheezing or shortness of breath.    Historical Provider, MD  diphenhydramine-acetaminophen (TYLENOL PM) 25-500 MG TABS Take 1 tablet by mouth at bedtime as needed (sleep).    Historical Provider, MD  levETIRAcetam (KEPPRA) 1000 MG tablet Take 1 tablet (1,000 mg total) by mouth 2 (two) times daily. 05/09/14   Sherwood Gambler, MD  predniSONE (DELTASONE) 20 MG tablet Take 1 tablet (20 mg total) by mouth 2 (two) times daily. 01/02/15   Daleen Bo, MD   Triage Vitals: BP 120/70 mmHg  Pulse 70  Temp(Src) 97.9 F (36.6 C) (Oral)  Resp 20  Wt 187 lb 9.6 oz (85.095 kg)  SpO2 100%  LMP 01/27/2015   Physical Exam  Constitutional: She is oriented to person, place, and time. She appears well-developed and well-nourished. No distress.  HENT:  Head: Normocephalic and atraumatic.  Eyes: Conjunctivae and EOM are normal.  Neck: Neck supple. No tracheal deviation present.  Cardiovascular: Normal rate, regular rhythm and normal heart sounds.  Exam reveals no gallop and no friction rub.   No murmur heard. Pulmonary/Chest: Effort  normal and breath sounds normal. No respiratory distress. She has no wheezes. She has no rales. She exhibits no tenderness.  Abdominal: Soft. There is no tenderness.  No CVA tenderness  Musculoskeletal: Normal range of motion.  Neurological: She is alert and oriented to person, place, and time.  Skin: Skin is warm and dry.  Right upper anterior thigh there is an area of induration with mild fluctuance; warm to the touch, erythematous; tender to palpation  Psychiatric: She has a normal mood and  affect. Her behavior is normal.  Nursing note and vitals reviewed.   ED Course  Procedures (including critical care time)  DIAGNOSTIC STUDIES: Oxygen Saturation is 100% on RA, normal by my interpretation.    COORDINATION OF CARE: 5:09 PM-Discussed treatment plan which includes pelvic exam, urine pregnancy, UA, genital wet prep, RPR, HIV antibody with pt at bedside and pt agreed to plan.   7:26 PM Pt with an infected insect bite to R thigh, evidence of cellulitis with questionable early forming abscess.  I offer I&D but pt declined.  She also report vaginal discomfort and mild urinary discomfort.  Pelvic examination is unremarkable.  UA showing questionable UTI.  Wet prep unremarkable.  Will prescribe doxy as treatment.  Return precaution discussed.   Labs Review Labs Reviewed  WET PREP, GENITAL - Abnormal; Notable for the following:    Clue Cells Wet Prep HPF POC FEW (*)    WBC, Wet Prep HPF POC FEW (*)    All other components within normal limits  URINALYSIS, ROUTINE W REFLEX MICROSCOPIC (NOT AT [facility]) - Abnormal; Notable for the following:    APPearance TURBID (*)    Leukocytes, UA SMALL (*)    All other components within normal limits  URINE MICROSCOPIC-ADD ON - Abnormal; Notable for the following:    Squamous Epithelial / LPF MANY (*)    Bacteria, UA MANY (*)    All other components within normal limits  RPR  HIV ANTIBODY (ROUTINE TESTING)  POC URINE PREG, ED  GC/CHLAMYDIA PROBE AMP ([place]) NOT AT [facility]      MDM   Final diagnoses:  Infected insect bite of right thigh, initial encounter  Vaginal discharge   BP 125/79 mmHg  Pulse 68  Temp(Src) 98 F (36.7 C) (Oral)  Resp 18  Wt 187 lb 9.6 oz (85.095 kg)  SpO2 100%  LMP 01/27/2015   I personally performed the services described in this documentation, which was scribed in my presence. The recorded information has been reviewed and is accurate.        Domenic Moras, PA-C 02/15/15 [place],  MD 02/16/15 [phone]

## 2015-02-15 NOTE — ED Notes (Signed)
Pt in c/o lower abd pain with vaginal discharge for the last few days, also reports abscess to right upper thigh that she would like evaluated, no distress noted

## 2015-02-16 LAB — GC/CHLAMYDIA PROBE AMP (~~LOC~~) NOT AT ARMC
Chlamydia: NEGATIVE
NEISSERIA GONORRHEA: NEGATIVE

## 2015-02-16 LAB — RPR: RPR Ser Ql: NONREACTIVE

## 2015-02-16 LAB — HIV ANTIBODY (ROUTINE TESTING W REFLEX): HIV SCREEN 4TH GENERATION: NONREACTIVE

## 2015-02-17 ENCOUNTER — Encounter (HOSPITAL_COMMUNITY): Payer: Self-pay | Admitting: *Deleted

## 2015-02-17 ENCOUNTER — Emergency Department (HOSPITAL_COMMUNITY)
Admission: EM | Admit: 2015-02-17 | Discharge: 2015-02-17 | Disposition: A | Discharge status: Home / Self Care | Payer: Self-pay | Attending: Emergency Medicine | Admitting: Emergency Medicine

## 2015-02-17 DIAGNOSIS — J45909 Unspecified asthma, uncomplicated: Secondary | ICD-10-CM | POA: Insufficient documentation

## 2015-02-17 DIAGNOSIS — Z79899 Other long term (current) drug therapy: Secondary | ICD-10-CM | POA: Insufficient documentation

## 2015-02-17 DIAGNOSIS — G40909 Epilepsy, unspecified, not intractable, without status epilepticus: Secondary | ICD-10-CM | POA: Insufficient documentation

## 2015-02-17 DIAGNOSIS — Z720 Tobacco use: Secondary | ICD-10-CM | POA: Insufficient documentation

## 2015-02-17 DIAGNOSIS — Z7952 Long term (current) use of systemic steroids: Secondary | ICD-10-CM | POA: Insufficient documentation

## 2015-02-17 DIAGNOSIS — F319 Bipolar disorder, unspecified: Secondary | ICD-10-CM | POA: Insufficient documentation

## 2015-02-17 DIAGNOSIS — Z87828 Personal history of other (healed) physical injury and trauma: Secondary | ICD-10-CM | POA: Insufficient documentation

## 2015-02-17 DIAGNOSIS — L02415 Cutaneous abscess of right lower limb: Secondary | ICD-10-CM | POA: Insufficient documentation

## 2015-02-17 DIAGNOSIS — Z86011 Personal history of benign neoplasm of the brain: Secondary | ICD-10-CM | POA: Insufficient documentation

## 2015-02-17 DIAGNOSIS — Z792 Long term (current) use of antibiotics: Secondary | ICD-10-CM | POA: Insufficient documentation

## 2015-02-17 MED ORDER — HYDROCODONE-ACETAMINOPHEN 5-325 MG PO TABS: 1.0000 | ORAL_TABLET | Freq: Four times a day (QID) | ORAL | Status: AC | PRN

## 2015-02-17 MED ORDER — TETANUS-DIPHTH-ACELL PERTUSSIS 5-2.5-18.5 LF-MCG/0.5 IM SUSP
0.5000 mL | Freq: Once | INTRAMUSCULAR | Status: AC
  Administered 2015-02-17: 0.5 mL via INTRAMUSCULAR
  Filled 2015-02-17: qty 0.5

## 2015-02-17 MED ORDER — LIDOCAINE-EPINEPHRINE (PF) 2 %-1:200000 IJ SOLN
10.0000 mL | Freq: Once | INTRAMUSCULAR | Status: AC
  Administered 2015-02-17: 10 mL via INTRADERMAL
  Filled 2015-02-17: qty 20

## 2015-02-17 NOTE — ED Provider Notes (Signed)
CSN: [id number]     Arrival date & time 02/17/15  1231 History   First MD Initiated Contact with Patient 02/17/15 1406     Chief Complaint  Patient presents with  . Abscess     (Consider location/radiation/quality/duration/timing/severity/associated sxs/prior Treatment) HPI   34 year old female presents for evaluation of right thigh pain. Patient believes she may have been bitten by an insect to her right anterior thigh approximately 4 days ago. She was seen 2 days ago for vaginal discharge and right thigh pain. She had a pelvic examination and was diagnosed with cellulitis of R thigh, potential early forming abscess. I offer I&D procedure initially however patient declined. Patient was discharged with antibiotic. She has not taking antibiotic and reports that her pain has increasing in intensity. Patient's tetanus is not up-to-date. Otherwise she denies having fever, increased abdominal pain, numbness or weakness.  Past Medical History  Diagnosis Date  . Brain tumor   . Seizures   . PTSD (post-traumatic stress disorder)   . Bipolar 1 disorder   . Schizoaffective disorder   . Asthma    History reviewed. No pertinent past surgical history. History reviewed. No pertinent family history. Social History  Substance Use Topics  . Smoking status: Current Every Day Smoker  . Smokeless tobacco: None  . Alcohol Use: No   OB History    No data available     Review of Systems  Constitutional: Negative for fever.  Skin: Positive for rash.  Neurological: Negative for numbness.      Allergies  Blueberry flavor; Pineapple; and Raspberry  Home Medications   Prior to Admission medications   Medication Sig Start Date End Date Taking? Authorizing Provider  albuterol (PROVENTIL HFA;VENTOLIN HFA) 108 (90 BASE) MCG/ACT inhaler Inhale 2 puffs into the lungs every 6 (six) hours as needed for wheezing or shortness of breath.    Historical Provider, MD  diphenhydramine-acetaminophen (TYLENOL  PM) 25-500 MG TABS Take 1 tablet by mouth at bedtime as needed (sleep).    Historical Provider, MD  doxycycline (VIBRAMYCIN) 100 MG capsule Take 1 capsule (100 mg total) by mouth 2 (two) times daily. One po bid x 7 days 02/15/15   Domenic Moras, PA-C  levETIRAcetam (KEPPRA) 1000 MG tablet Take 1 tablet (1,000 mg total) by mouth 2 (two) times daily. 05/09/14   Sherwood Gambler, MD  predniSONE (DELTASONE) 20 MG tablet Take 1 tablet (20 mg total) by mouth 2 (two) times daily. 01/02/15   Daleen Bo, MD   BP 117/53 mmHg  Pulse 76  Temp(Src) 98.3 F (36.8 C) (Oral)  Resp 18  Ht 5' 4.5" (1.638 m)  Wt 187 lb (84.823 kg)  BMI 31.61 kg/m2  SpO2 100%  LMP 01/27/2015 Physical Exam  Constitutional: She appears well-developed and well-nourished. No distress.  HENT:  Head: Atraumatic.  Eyes: Conjunctivae are normal.  Neck: Neck supple.  Neurological: She is alert.  Skin: Rash (Right anterior thigh: An area of erythema measuring 6 cm in diameter with an overlying scab, moderate tenderness to palpation. Mild induration without any significant fluctuant.) noted.  Psychiatric: She has a normal mood and affect.  Nursing note and vitals reviewed.   ED Course  Procedures (including critical care time)  Patient presents with an infected insect bite, perhaps early forming abscess. Will perform I&D. Tetanus shot given.  INCISION AND DRAINAGE Performed by: Domenic Moras, with PA student Etheleen Nicks Consent: Verbal consent obtained. Risks and benefits: risks, benefits and alternatives were discussed Type: abscess  Body area: R  anterior thigh  Anesthesia: local infiltration  Incision was made with a scalpel.  Local anesthetic: lidocaine 2% w epinephrine  Anesthetic total: 3 ml  Complexity: complex Blunt dissection to break up loculations  Drainage: purulent  Drainage amount: moderate  Packing material: 1/4 in iodoform gauze  Patient tolerance: Patient tolerated the procedure well with no  immediate complications.    MDM   Final diagnoses:  Abscess of right thigh    BP 117/53 mmHg  Pulse 76  Temp(Src) 98.3 F (36.8 C) (Oral)  Resp 18  Ht 5' 4.5" (1.638 m)  Wt 187 lb (84.823 kg)  BMI 31.61 kg/m2  SpO2 100%  LMP 01/27/2015     Domenic Moras, PA-C 02/17/15 [place], MD 02/17/15 [phone]

## 2015-02-17 NOTE — Discharge Instructions (Signed)
Follow up at Urgent Care in 2 days to have your wound recheck and packing removed.  Take antibiotic as prescribed and take pain medication as needed.    Abscess Care After An abscess (also called a boil or furuncle) is an infected area that contains a collection of pus. Signs and symptoms of an abscess include pain, tenderness, redness, or hardness, or you may feel a moveable soft area under your skin. An abscess can occur anywhere in the body. The infection may spread to surrounding tissues causing cellulitis. A cut (incision) by the surgeon was made over your abscess and the pus was drained out. Gauze may have been packed into the space to provide a drain that will allow the cavity to heal from the inside outwards. The boil may be painful for 5 to 7 days. Most people with a boil do not have high fevers. Your abscess, if seen early, may not have localized, and may not have been lanced. If not, another appointment may be required for this if it does not get better on its own or with medications. HOME CARE INSTRUCTIONS   Only take over-the-counter or prescription medicines for pain, discomfort, or fever as directed by your caregiver.  When you bathe, soak and then remove gauze or iodoform packs at least daily or as directed by your caregiver. You may then wash the wound gently with mild soapy water. Repack with gauze or do as your caregiver directs. SEEK IMMEDIATE MEDICAL CARE IF:   You develop increased pain, swelling, redness, drainage, or bleeding in the wound site.  You develop signs of generalized infection including muscle aches, chills, fever, or a general ill feeling.  An oral temperature above 102 F (38.9 C) develops, not controlled by medication. See your caregiver for a recheck if you develop any of the symptoms described above. If medications (antibiotics) were prescribed, take them as directed. Document Released: 11/28/2004 Document Revised: 08/04/2011 Document Reviewed:  07/26/2007 [facility] Patient Information 2015 [place], [place]. This information is not intended to replace advice given to you by your health care provider. Make sure you discuss any questions you have with your health care provider.

## 2015-02-17 NOTE — ED Notes (Signed)
Pt seen here on Thursday for same. Has possible insect bite/abscess to right thigh that has gotten worse. Has not taken antibiotics as prescribed.

## 2015-02-21 ENCOUNTER — Emergency Department (HOSPITAL_COMMUNITY)
Admission: EM | Admit: 2015-02-21 | Discharge: 2015-02-21 | Disposition: A | Discharge status: Home / Self Care | Payer: Self-pay | Attending: Emergency Medicine | Admitting: Emergency Medicine

## 2015-02-21 DIAGNOSIS — Z4801 Encounter for change or removal of surgical wound dressing: Secondary | ICD-10-CM | POA: Insufficient documentation

## 2015-02-21 DIAGNOSIS — J45909 Unspecified asthma, uncomplicated: Secondary | ICD-10-CM | POA: Insufficient documentation

## 2015-02-21 DIAGNOSIS — F319 Bipolar disorder, unspecified: Secondary | ICD-10-CM | POA: Insufficient documentation

## 2015-02-21 DIAGNOSIS — Z720 Tobacco use: Secondary | ICD-10-CM | POA: Insufficient documentation

## 2015-02-21 DIAGNOSIS — G40909 Epilepsy, unspecified, not intractable, without status epilepticus: Secondary | ICD-10-CM | POA: Insufficient documentation

## 2015-02-21 DIAGNOSIS — Z09 Encounter for follow-up examination after completed treatment for conditions other than malignant neoplasm: Secondary | ICD-10-CM

## 2015-02-21 DIAGNOSIS — Z79899 Other long term (current) drug therapy: Secondary | ICD-10-CM | POA: Insufficient documentation

## 2015-02-21 NOTE — ED Notes (Signed)
Pt is in stable condition upon d/c and ambulates from ED. 

## 2015-02-21 NOTE — ED Notes (Addendum)
Pt returns to have packing removed from right leg. Pt also st's she was given Rx for antibiotic when she was here for vag. Discharge.  Pt st's it's too expensive and needs a cheaper antibiotic

## 2015-02-21 NOTE — ED Provider Notes (Signed)
CSN: [id number]     Arrival date & time 02/21/15  1448 History  By signing my name below, I, Stacy May, attest that this documentation has been prepared under the direction and in the presence of [employer], PA-C. Electronically Signed: Eustaquio May, ED Scribe. 02/21/2015. 4:10 PM.  Chief Complaint  Patient presents with  . Wound Check   The history is provided by the patient. No language interpreter was used.     HPI Comments: Stacy May is a 34 y.o. female who presents to the Emergency Department for wound check. Pt was seen in the ED on 02/17/2015 (approximately 4 days ago) for abscess to right thigh for which she had an I&D and packing done. Pt was told to come back to the ED In 4 days to have packing removed. She states that the wound has been healing well. Denies fever, chills, or any other associated symptoms.   Past Medical History  Diagnosis Date  . Brain tumor   . Seizures   . PTSD (post-traumatic stress disorder)   . Bipolar 1 disorder   . Schizoaffective disorder   . Asthma    No past surgical history on file. No family history on file. Social History  Substance Use Topics  . Smoking status: Current Every Day Smoker  . Smokeless tobacco: Not on file  . Alcohol Use: No   OB History    No data available     Review of Systems  Constitutional: Negative for fever and chills.  Skin: Positive for wound (Abscess to right thigh).   Allergies  Blueberry flavor; Pineapple; and Raspberry  Home Medications   Prior to Admission medications   Medication Sig Start Date End Date Taking? Authorizing Provider  albuterol (PROVENTIL HFA;VENTOLIN HFA) 108 (90 BASE) MCG/ACT inhaler Inhale 2 puffs into the lungs every 6 (six) hours as needed for wheezing or shortness of breath.    Historical Provider, MD  diphenhydramine-acetaminophen (TYLENOL PM) 25-500 MG TABS Take 1 tablet by mouth at bedtime as needed (sleep).    Historical Provider, MD  doxycycline  (VIBRAMYCIN) 100 MG capsule Take 1 capsule (100 mg total) by mouth 2 (two) times daily. One po bid x 7 days 02/15/15   Domenic Moras, PA-C  HYDROcodone-acetaminophen (NORCO/VICODIN) 5-325 MG per tablet Take 1 tablet by mouth every 6 (six) hours as needed for moderate pain. 02/17/15   Domenic Moras, PA-C  levETIRAcetam (KEPPRA) 1000 MG tablet Take 1 tablet (1,000 mg total) by mouth 2 (two) times daily. 05/09/14   Sherwood Gambler, MD  predniSONE (DELTASONE) 20 MG tablet Take 1 tablet (20 mg total) by mouth 2 (two) times daily. 01/02/15   Daleen Bo, MD   Triage Vitals: BP 113/71 mmHg  Pulse 72  Temp(Src) 98.1 F (36.7 C) (Oral)  Resp 16  SpO2 99%  LMP 01/27/2015   Physical Exam  Constitutional: She is oriented to person, place, and time. She appears well-developed and well-nourished. No distress.  HENT:  Head: Normocephalic and atraumatic.  Eyes: Conjunctivae and EOM are normal.  Neck: Neck supple. No tracheal deviation present.  Cardiovascular: Normal rate.   Pulmonary/Chest: Effort normal. No respiratory distress.  Musculoskeletal: Normal range of motion.  Neurological: She is alert and oriented to person, place, and time.  Skin: Skin is warm and dry.  Right thigh abscess that is healing well without surrounding redness or drainage Packing was removed by myself There is a 2 cm deep hole that I will repack  Psychiatric: She has a  normal mood and affect. Her behavior is normal.  Nursing note and vitals reviewed.   ED Course  Wound packing Date/Time: 02/21/2015 5:46 PM Performed by: Ottie Glazier Authorized by: Ottie Glazier Consent: Verbal consent obtained. Written consent obtained. Risks and benefits: risks, benefits and alternatives were discussed Consent given by: patient Patient understanding: patient states understanding of the procedure being performed Patient consent: the patient's understanding of the procedure matches consent given Patient identity confirmed: verbally  with patient Preparation: Patient was prepped and draped in the usual sterile fashion. Local anesthesia used: no Patient sedated: no Patient tolerance: Patient tolerated the procedure well with no immediate complications Comments: 1/4 iodoform packing was used to pack the right thigh.     (including critical care time)  DIAGNOSTIC STUDIES: Oxygen Saturation is 99% on RA, normal by my interpretation.    COORDINATION OF CARE: 4:09 PM-Discussed treatment plan which includes packing removal and repacking abscess with pt at bedside and pt agreed to plan.   Labs Review Labs Reviewed - No data to display  Imaging Review No results found.    EKG Interpretation None      MDM   Final diagnoses:  Encounter for recheck of abscess following incision and drainage  Patient presents for abscess recheck. She is afebrile well-appearing. The abscess appears to be healing well but was repacked today. No signs of infection. I discussed removing the packing in 48 hours and having the wound heal on its own. I discussed return precautions with the patient as well as follow-up and she verbally agrees with the plan.  I personally performed the services described in this documentation, which was scribed in my presence. The recorded information has been reviewed and is accurate.      Ottie Glazier, PA-C 02/21/15 1848  Wandra Arthurs, MD 02/22/15 [phone]

## 2015-02-21 NOTE — Discharge Instructions (Signed)
Wound Check Follow-up with a primary care provider using the resource guide below. The wound appears to be healing well today. Remove the packing in 48 hours and let the wound heal on its own. Return for any fever, surrounding redness, or drainage. Your wound appears healthy today. Your wound will heal gradually over time. Eventually a scar will form that will fade with time. FACTORS THAT AFFECT SCAR FORMATION:  People differ in the severity in which they scar.  Scar severity varies according to location, size, and the traits you inherited from your parents (genetic predisposition).  Irritation to the wound from infection, rubbing, or chemical exposure will increase the amount of scar formation. HOME CARE INSTRUCTIONS   If you were given a dressing, you should change it at least once a day or as instructed by your caregiver. If the bandage sticks, soak it off with a solution of hydrogen peroxide.  If the bandage becomes wet, dirty, or develops a bad smell, change it as soon as possible.  Look for signs of infection.  Only take over-the-counter or prescription medicines for pain, discomfort, or fever as directed by your caregiver. SEEK IMMEDIATE MEDICAL CARE IF:   You have redness, swelling, or increasing pain in the wound.  You notice pus coming from the wound.  You have a fever.  You notice a bad smell coming from the wound or dressing. Document Released: 02/16/2004 Document Revised: 08/04/2011 Document Reviewed: 05/12/2005 [facility] Patient Information 2015 [place], [place]. This information is not intended to replace advice given to you by your health care provider. Make sure you discuss any questions you have with your health care provider.  Emergency Department Resource Guide 1) Find a Doctor and Pay Out of Pocket Although you won't have to find out who is covered by your insurance plan, it is a good idea to ask around and get recommendations. You will then need to call the  office and see if the doctor you have chosen will accept you as a new patient and what types of options they offer for patients who are self-pay. Some doctors offer discounts or will set up payment plans for their patients who do not have insurance, but you will need to ask so you aren't surprised when you get to your appointment.  2) Contact Your Local Health Department Not all health departments have doctors that can see patients for sick visits, but many do, so it is worth a call to see if yours does. If you don't know where your local health department is, you can check in your phone book. The CDC also has a tool to help you locate your state's health department, and many state websites also have listings of all of their local health departments.  3) Find a [facility] If your illness is not likely to be very severe or complicated, you may want to try a walk in clinic. These are popping up all over the country in pharmacies, drugstores, and shopping centers. They're usually staffed by nurse practitioners or physician assistants that have been trained to treat common illnesses and complaints. They're usually fairly quick and inexpensive. However, if you have serious medical issues or chronic medical problems, these are probably not your best option.  No Primary Care Doctor: - Call Health Connect at  [phone] - they can help you locate a primary care doctor that  accepts your insurance, provides certain services, etc. - Physician Referral Service- [phone]  Chronic Pain Problems: Organization  Address  Phone   Notes  °[facility]  (336) 297-2271 Patients need to be referred by their primary care doctor.  ° °Medication Assistance: °Organization         Address  Phone   Notes  °[place] [facility] [address] °[place], [place] [address] (336) 641-8030 --Must be a resident of [place] °-- Must have NO insurance coverage  whatsoever (no Medicaid/ Medicare, etc.) °-- The pt. MUST have a primary care doctor that directs their care regularly and follows them in the community °  °[facility]  (866) 331-1348   °[employer]  (888) 892-1162   ° °Agencies that provide inexpensive medical care: °Organization         Address  Phone   Notes  °[facility]  (336) 832-8035   °[facility]    (336) 832-7272   °[facility] [address] °[place], [place] [address] (336) 832-4777   °[facility] [address], °[place] (336) 271-4999   °[facility]    (336) 373-0678   °[facility]    (336) 272-1050   °[facility] ° [address], [place] Phone:  (336) 832-4444, Fax:  (336) 832-4440 Hours of Operation:  9 am - 6 pm, M-F.  Also accepts Medicaid/Medicare and self-pay.  °[facility] ° [address], [place] Phone: (336) 832-3150, Fax: (336) 832-3151. Hours of Operation:  8:30 am - 5:30 pm, M-F.  Also accepts Medicaid and self-pay.  °[facility] [place] [address], [place] Phone: (336) 878-6027   °[facility] [address], [place], [place] (336)723-1848, Ext. 123 Mondays & Thursdays: 7-9 AM.  First 15 patients are seen on a first come, first serve basis. °  ° °Medicaid-accepting [place] Providers: ° °Organization         Address  Phone   Notes  °[facility] [address], [place] (336) 641-2100 Also accepts self-pay patients.  °[facility] [address], [place] ° (336) 856-9996   °[facility] [address], [place] (336) 288-8857   °[facility] [address] [place] [address], [place] (336) 299-7000   °Veita Bland [address], [place]  ° (336) 373-1557 Only accepts [place] Access Medicaid patients after they have their name applied  to their card.  ° °Self-Pay (no insurance) in [place]: ° °Organization         Address  Phone   Notes  °Sickle Cell Patients, [facility] [address], [place] (336) 832-1970   °[facility] [address], [place] (336) 832-4400   °[facility] ° [address],  (336) 992-4800   °[facility]/Dr. Osei-Bonsu ° [address] [place] [address], [place] or [address], [place] (336) 841-8500 Phone number for both [place] and [place] locations is the same.  °[facility] [address], [place] (336) 299-0000   °[facility] [address] [place] [address], [place] or [address] (336) 852-7530 °(336) 878-2260   °[facility] [address],  (336) 350-1642, phone; (336) 294-5005, fax Sees patients 1st and 3rd Saturday of every month.  Must not qualify   for public or private insurance (i.e. Medicaid, Medicare, [place] Health Choice, Veterans' Benefits) • Household income should be no more than 200% of the poverty level •The clinic cannot treat you if you are pregnant or think you are pregnant • Sexually transmitted diseases are not treated at the clinic.  ° ° °Dental Care: °Organization         Address  Phone  Notes  °[facility] [address], [place] (336) 641-6152 Accepts children up to age 21 who are enrolled in Medicaid or [place] Health Choice; pregnant women with a Medicaid card; and children who have applied for Medicaid or [place] Health Choice, but were declined, whose parents can pay a reduced fee at time of service.  °[facility] [place]  [address], [place] (336) 641-7733 Accepts children up to age 21 who are enrolled in Medicaid or [place] Health Choice; pregnant women with a Medicaid card; and children who have applied for Medicaid or [place]  Health Choice, but were declined, whose parents can pay a reduced fee at time of service.  °[facility] ° [address], [place] (336) 641-4533 Patients are seen by appointment only. Walk-ins are not accepted. [facility] will see patients 18 years of age and older. °Monday - Tuesday (8am-5pm) °Most Wednesdays (8:30-5pm) °$30 per visit, cash only  °[facility] ° [address], [place] (336) 641-4533 Patients are seen by appointment only. Walk-ins are not accepted. [facility] will see patients 18 years of age and older. °One Wednesday Evening (Monthly: Volunteer Based).  $30 per visit, cash only  °[facility]  (919) 537-3737 for adults; Children under age 4, call Graduate Pediatric Dentistry at ([phone]) [phone]. Children aged 4-14, please call (919) [phone] to request a pediatric application. ° Dental services are provided in all areas of dental care including fillings, crowns and bridges, complete and partial dentures, implants, gum treatment, root canals, and extractions. Preventive care is also provided. Treatment is provided to both adults and children. °Patients are selected via a lottery and there is often a waiting list. °  °[facility] [address], °[place] ° (336) 763-8833 [email or web address] °  °[facility] [address], [place], [place] (336)723-1848, Ext. 123 Second and Fourth Thursday of each month, opens at 6:30 AM; Clinic ends at 9 AM.  Patients are seen on a first-come first-served basis, and a limited number are seen during each clinic.  ° °[facility] ° [address], [place], [place] (336) 723-7904   Eligibility Requirements °You must have lived in [place], [place], or [place] counties for at least the last three months. °  You cannot be eligible for state or federal sponsored healthcare insurance, including Veterans Administration, Medicaid, or Medicare. °   You generally cannot be eligible for healthcare insurance through your employer.  °  How to apply: °Eligibility screenings are held every Tuesday and Wednesday afternoon from 1:00 pm until 4:00 pm. You do not need an appointment for the interview!  °[facility] [address], [place], [place] 336-631-2330   °[facility]  336-342-8273   °[facility]  336-703-3100   °[facility]  336-570-6415   ° °Behavioral Health Resources in the Community: °Intensive Outpatient Programs °Organization         Address  Phone    Notes  °[facility] [address], [place], [place] 336-878-6098   °[facility] [address], [place], [place] 336-832-9800   °[facility] [address], [place], [place] ° 336-882-2125   °[facility] [address],  °[place], [place] 1-800-853-5163 or 336-641-4981   °Substance Abuse Resources °Organization         Address  Phone  Notes  °[facility]  336-882-2125   °[facility]  336-784-9470   °[facility]  336-285-9073   °[facility]  336-845-3988   °Residential & Outpatient Substance Abuse Program  1-800-659-3381   °Psychological Services °Organization         Address  Phone  Notes  °[facility]  336- 832-9600   °[facility]  336- 378-7881   °[facility] [address], [place] 1-800-853-5163 or 336-641-4981   ° °Mobile Crisis Teams °Organization         Address  Phone  Notes  °[facility], Mobile Crisis Care Unit  1-877-626-1772   °Assertive °[facility] ° [address] [place], [place] 336-834-9664   °Sharon DeEsch [address] °[place] 336-554-5454   ° °Self-Help/Support Groups °Organization         Address  Phone             Notes  °[facility] - variety of support groups  336- 373-1402 Call  for more information  °Narcotics Anonymous (NA), [facility] [address], °[place]  2 meetings at this location  ° °Residential Treatment Programs °Organization         Address  Phone  Notes  °[facility] [address],    °[place]  1-866-801-8205   °[facility] ° [address], [place], [place] 704-293-8524   °[facility] [address], [place] 336-845-3988 Admissions: 8am-3pm M-F  °[facility] [address]    °[place], [place] 336-841-1104   °[facility] [address], [place], [place] 336-379-7146   °[facility] [address]  °[place], [place] 336-285-9073   °[facility] Intensive Outpatient [address], [place], [place] 336-852-3033   °[facility] [address]  °[place], [place] 1-877-615-2722 or 336-784-9470   °[facility] [address] [place], [place] 336-227-7417 Accepts Medicaid  °[facility] [address]  ° [place] 1-800-659-3381 Substance Abuse/Addiction Treatment  ° °[place] Behavioral Health Resources °Organization         Address  Phone  Notes  °[facility]  (888) 581-9988   °Julie Brannon, PhD [address] [place], [place]   (336) 349-5553 or (336) 951-0000   °[facility]   [address] °[place], [place] (336) 349-4454   °[facility] [address], [place], [place] (336) 342-8316 Insurance/Medicaid/sponsorship through [facility]  °[facility] [address]                                    [place], [place] (336) 342-8316 Therapy/tele-psych/case  °[facility] [address]  ° [place], [place] (336) 349-2233    °Dr. Arfeen  (336) 349-4544   °[facility] [place]  [employer] [place]   [facility] 1) [address], [place] 2) [place] 3)  [address], [place]  [phone]) [phone]  [phone]   [place] [phone] or ([phone] (After Hours)

## 2015-02-21 NOTE — ED Notes (Signed)
Pt reports to the ED for eval of packing removal to right upper thigh. She was seen 2 days ago and had the area lanced and packing placed and was told to come here in 2 days for removal. Pt denies any fevers, chills, or increased erythema or warmth. Pt A&Ox4, resp e/u, and skin warm and dry.

## 2015-03-05 ENCOUNTER — Emergency Department (HOSPITAL_COMMUNITY)
Admission: EM | Admit: 2015-03-05 | Discharge: 2015-03-05 | Disposition: A | Discharge status: Home / Self Care | Payer: Self-pay | Attending: Emergency Medicine | Admitting: Emergency Medicine

## 2015-03-05 ENCOUNTER — Encounter (HOSPITAL_COMMUNITY): Payer: Self-pay | Admitting: Emergency Medicine

## 2015-03-05 DIAGNOSIS — Z720 Tobacco use: Secondary | ICD-10-CM | POA: Insufficient documentation

## 2015-03-05 DIAGNOSIS — R569 Unspecified convulsions: Secondary | ICD-10-CM

## 2015-03-05 DIAGNOSIS — G40909 Epilepsy, unspecified, not intractable, without status epilepticus: Secondary | ICD-10-CM | POA: Insufficient documentation

## 2015-03-05 DIAGNOSIS — Z8659 Personal history of other mental and behavioral disorders: Secondary | ICD-10-CM | POA: Insufficient documentation

## 2015-03-05 DIAGNOSIS — L97822 Non-pressure chronic ulcer of other part of left lower leg with fat layer exposed: Secondary | ICD-10-CM | POA: Insufficient documentation

## 2015-03-05 DIAGNOSIS — Z79899 Other long term (current) drug therapy: Secondary | ICD-10-CM | POA: Insufficient documentation

## 2015-03-05 DIAGNOSIS — L97922 Non-pressure chronic ulcer of unspecified part of left lower leg with fat layer exposed: Secondary | ICD-10-CM

## 2015-03-05 DIAGNOSIS — J45909 Unspecified asthma, uncomplicated: Secondary | ICD-10-CM | POA: Insufficient documentation

## 2015-03-05 MED ORDER — BACITRACIN ZINC 500 UNIT/GM EX OINT
1.0000 "application " | TOPICAL_OINTMENT | Freq: Two times a day (BID) | CUTANEOUS | Status: DC
  Administered 2015-03-05: 1 via TOPICAL

## 2015-03-05 NOTE — Discharge Instructions (Signed)
As discussed, with your history of seizures, as well as new episodes of skin lesions is very important that you follow-up with our primary care center. The health center takes walk-in patients starting at 9 AM on weekdays. Please be sure to follow-up tomorrow to ensure appropriate ongoing care.

## 2015-03-05 NOTE — ED Provider Notes (Signed)
CSN: [id number]     Arrival date & time 03/05/15  2101 History   First MD Initiated Contact with Patient 03/05/15 2126     Chief Complaint  Patient presents with  . Seizures     (Consider location/radiation/quality/duration/timing/severity/associated sxs/prior Treatment) HPI Patient presents after a seizure today, but with no current complaints beyond nonhealing ulceration on the left lower extremity. Patient acknowledges a history of seizures, ADHD, states that she has taken no medication in 3 weeks, and prior to that was only using her mother's medication for seizure control. Patient previously was on [place]. Today, vision probably had a seizure was witnessed by bystanders. EMS reports the patient was postictal on arrival, but returned to baseline interactivity and transport. Patient complains of no access to primary care, no ability to fill prescriptions.  She denies confusion, disorientation, fever, chills.   Past Medical History  Diagnosis Date  . Brain tumor ([place])   . Seizures ([place])   . PTSD (post-traumatic stress disorder)   . Bipolar 1 disorder ([place])   . Schizoaffective disorder ([place])   . Asthma    History reviewed. No pertinent past surgical history. History reviewed. No pertinent family history. Social History  Substance Use Topics  . Smoking status: Current Every Day Smoker  . Smokeless tobacco: None  . Alcohol Use: No   OB History    No data available     Review of Systems  Constitutional:       Per HPI, otherwise negative  HENT:       Per HPI, otherwise negative  Respiratory:       Per HPI, otherwise negative  Cardiovascular:       Per HPI, otherwise negative  Gastrointestinal: Negative for vomiting.  Endocrine:       Negative aside from HPI  Genitourinary:       Neg aside from HPI   Musculoskeletal:       Per HPI, otherwise negative  Skin: Positive for wound.  Neurological: Positive for seizures. Negative for syncope.      Allergies  Bee  venom; Blueberry flavor; Other; Pineapple; and Raspberry  Home Medications   Prior to Admission medications   Medication Sig Start Date End Date Taking? Authorizing Provider  albuterol (PROVENTIL HFA;VENTOLIN HFA) 108 (90 BASE) MCG/ACT inhaler Inhale 2 puffs into the lungs every 6 (six) hours as needed for wheezing or shortness of breath.    Historical Provider, MD  doxycycline (VIBRAMYCIN) 100 MG capsule Take 1 capsule (100 mg total) by mouth 2 (two) times daily. One po bid x 7 days Patient not taking: Reported on 03/05/2015 02/15/15   Domenic Moras, PA-C  HYDROcodone-acetaminophen (NORCO/VICODIN) 5-325 MG per tablet Take 1 tablet by mouth every 6 (six) hours as needed for moderate pain. 02/17/15   Domenic Moras, PA-C  levETIRAcetam (KEPPRA) 1000 MG tablet Take 1 tablet (1,000 mg total) by mouth 2 (two) times daily. Patient not taking: Reported on 03/05/2015 05/09/14   Sherwood Gambler, MD  predniSONE (DELTASONE) 20 MG tablet Take 1 tablet (20 mg total) by mouth 2 (two) times daily. 01/02/15   Daleen Bo, MD   BP 126/71 mmHg  Pulse 59  Temp(Src) 98.1 F (36.7 C) (Oral)  Resp 21  SpO2 100%  LMP 01/27/2015 Physical Exam  Constitutional: She is oriented to person, place, and time. She appears well-developed and well-nourished. No distress.  HENT:  Head: Normocephalic and atraumatic.  Eyes: Conjunctivae and EOM are normal.  Cardiovascular: Regular rhythm.   Pulmonary/Chest: Effort normal.  No stridor. No respiratory distress.  Abdominal: She exhibits no distension.  Musculoskeletal: She exhibits no edema.  Neurological: She is alert and oriented to person, place, and time. No cranial nerve deficit.  Skin: Skin is warm and dry.     Psychiatric: She has a normal mood and affect.  Nursing note and vitals reviewed.   ED Course  Procedures (including critical care time)  Chart review demonstrates multiple visits recently, and after the initial evaluation I discussed initiation of management by  primary care with patient. We discussed referral to our affiliated primary care Center, including walking option to obtain medication.    MDM   presents after witnessed seizure. Here the patient has returned to baseline, is appropriately interactive, in no distress, awake, alert, answering questions appropriately. As the patient has not used any medication in 3 weeks, has previously only been using family members medication, she was discharged without a new prescription, but with instructions on how to follow-up with our facility to primary care center as early as tomorrow for re-initiation of appropriate medication. Patient does have one nonhealing ulcer on the left leg, which was treated with topical antibiotics, with no evidence for synovitis, she did not require admission for further evaluation, management.    Carmin Muskrat, MD 03/05/15 2223

## 2015-03-05 NOTE — ED Notes (Addendum)
Per the patient, stated she had a seizure today that was witnessed by bystanders. Per EMS, patient was post-ictal upon arrival. Pt arrived to the ER CAO and answering questions appropriately. Pt with hx of seizures, but has been out of her Andres Labrum for about a year. Pt has been taking mother's seizure medicine. Pt also stated that she has had a migraine headache with n/v for past week. Pt also states multiple spider bites on legs and hip.

## 2015-08-12 ENCOUNTER — Emergency Department (HOSPITAL_COMMUNITY): Payer: MEDICAID

## 2015-08-12 ENCOUNTER — Emergency Department (HOSPITAL_COMMUNITY)
Admission: EM | Admit: 2015-08-12 | Discharge: 2015-08-12 | Disposition: A | Discharge status: Home / Self Care | Payer: Self-pay | Attending: Emergency Medicine | Admitting: Emergency Medicine

## 2015-08-12 ENCOUNTER — Encounter (HOSPITAL_COMMUNITY): Payer: Self-pay | Admitting: Emergency Medicine

## 2015-08-12 DIAGNOSIS — W228XXA Striking against or struck by other objects, initial encounter: Secondary | ICD-10-CM | POA: Insufficient documentation

## 2015-08-12 DIAGNOSIS — Z79899 Other long term (current) drug therapy: Secondary | ICD-10-CM | POA: Insufficient documentation

## 2015-08-12 DIAGNOSIS — S9032XA Contusion of left foot, initial encounter: Secondary | ICD-10-CM | POA: Insufficient documentation

## 2015-08-12 DIAGNOSIS — Y9389 Activity, other specified: Secondary | ICD-10-CM | POA: Insufficient documentation

## 2015-08-12 DIAGNOSIS — F17200 Nicotine dependence, unspecified, uncomplicated: Secondary | ICD-10-CM | POA: Insufficient documentation

## 2015-08-12 DIAGNOSIS — Y998 Other external cause status: Secondary | ICD-10-CM | POA: Insufficient documentation

## 2015-08-12 DIAGNOSIS — Z85841 Personal history of malignant neoplasm of brain: Secondary | ICD-10-CM | POA: Insufficient documentation

## 2015-08-12 DIAGNOSIS — Z7952 Long term (current) use of systemic steroids: Secondary | ICD-10-CM | POA: Insufficient documentation

## 2015-08-12 DIAGNOSIS — S92502A Displaced unspecified fracture of left lesser toe(s), initial encounter for closed fracture: Secondary | ICD-10-CM

## 2015-08-12 DIAGNOSIS — J45909 Unspecified asthma, uncomplicated: Secondary | ICD-10-CM | POA: Insufficient documentation

## 2015-08-12 DIAGNOSIS — S92515A Nondisplaced fracture of proximal phalanx of left lesser toe(s), initial encounter for closed fracture: Secondary | ICD-10-CM | POA: Insufficient documentation

## 2015-08-12 DIAGNOSIS — Y9289 Other specified places as the place of occurrence of the external cause: Secondary | ICD-10-CM | POA: Insufficient documentation

## 2015-08-12 DIAGNOSIS — Z8659 Personal history of other mental and behavioral disorders: Secondary | ICD-10-CM | POA: Insufficient documentation

## 2015-08-12 MED ORDER — HYDROCODONE-ACETAMINOPHEN 5-325 MG PO TABS
1.0000 | ORAL_TABLET | Freq: Once | ORAL | Status: AC
  Administered 2015-08-12: 1 via ORAL
  Filled 2015-08-12: qty 1

## 2015-08-12 MED ORDER — IBUPROFEN 400 MG PO TABS
800.0000 mg | ORAL_TABLET | Freq: Once | ORAL | Status: AC
  Administered 2015-08-12: 800 mg via ORAL
  Filled 2015-08-12: qty 2

## 2015-08-12 MED ORDER — HYDROCODONE-ACETAMINOPHEN 5-325 MG PO TABS: 1.0000 | ORAL_TABLET | ORAL | Status: AC | PRN

## 2015-08-12 MED ORDER — NAPROXEN 500 MG PO TABS: 500.0000 mg | ORAL_TABLET | Freq: Two times a day (BID) | ORAL | Status: AC

## 2015-08-12 NOTE — ED Notes (Signed)
Stumped two toes on a wooden chair.  Pain and swelling in left foot.  Pedal pulse 2 plus.  Able to move great toe and next toe.  The remaining toes unable to move

## 2015-08-12 NOTE — Discharge Instructions (Signed)
Please call ortho to schedule a follow up appointment. In the meantime I will give you a couple prescriptions to help with your pain. Keep your foot elevated when you are resting at home.    Toe Fracture A toe fracture is a break in one of the toe bones (phalanges). CAUSES This condition may be caused by:  Dropping a heavy object on your toe.  Stubbing your toe.  Overusing your toe or doing repetitive exercise.  Twisting or stretching your toe out of place. RISK FACTORS This condition is more likely to develop in people who:  Play contact sports.  Have a bone disease.  Have a low calcium level. SYMPTOMS The main symptoms of this condition are swelling and pain in the toe. The pain may get worse with standing or walking. Other symptoms include:  Bruising.  Stiffness.  Numbness.  A change in the way the toe looks.  Broken bones that poke through the skin.  Blood beneath the toenail. DIAGNOSIS This condition is diagnosed with a physical exam. You may also have X-rays. TREATMENT  Treatment for this condition depends on the type of fracture and its severity. Treatment may involve:  Taping the broken toe to a toe that is next to it (buddy taping). This is the most common treatment for fractures in which the bone has not moved out of place (nondisplaced fracture).  Wearing a shoe that has a wide, rigid sole to protect the toe and to limit its movement.  Wearing a walking cast.  Having a procedure to move the toe back into place.  Surgery. This may be needed:  If there are many pieces of broken bone that are out of place (displaced).  If the toe joint breaks.  If the bone breaks through the skin.  Physical therapy. This is done to help regain movement and strength in the toe. You may need follow-up X-rays to make sure that the bone is healing well and staying in position. HOME CARE INSTRUCTIONS If You Have a Cast:  Do not stick anything inside the cast to  scratch your skin. Doing that increases your risk of infection.  Check the skin around the cast every day. Report any concerns to your health care provider. You may put lotion on dry skin around the edges of the cast. Do not apply lotion to the skin underneath the cast.  Do not put pressure on any part of the cast until it is fully hardened. This may take several hours.  Keep the cast clean and dry. Bathing  Do not take baths, swim, or use a hot tub until your health care provider approves. Ask your health care provider if you can take showers. You may only be allowed to take sponge baths for bathing.  If your health care provider approves bathing and showering, cover the cast or bandage (dressing) with a watertight plastic bag to protect it from water. Do not let the cast or dressing get wet. Managing Pain, Stiffness, and Swelling  If you do not have a cast, apply ice to the injured area, if directed.  Put ice in a plastic bag.  Place a towel between your skin and the bag.  Leave the ice on for 20 minutes, 2-3 times per day.  Move your toes often to avoid stiffness and to lessen swelling.  Raise (elevate) the injured area above the level of your heart while you are sitting or lying down. Driving  Do not drive or operate heavy machinery while  taking pain medicine.  Do not drive while wearing a cast on a foot that you use for driving. Activity  Return to your normal activities as directed by your health care provider. Ask your health care provider what activities are safe for you.  Perform exercises daily as directed by your health care provider or physical therapist. Safety  Do not use the injured limb to support your body weight until your health care provider says that you can. Use crutches or other assistive devices as directed by your health care provider. General Instructions  If your toe was treated with buddy taping, follow your health care provider's instructions for  changing the gauze and tape. Change it more often:  The gauze and tape get wet. If this happens, dry the space between the toes.  The gauze and tape are too tight and cause your toe to become pale or numb.  Wear a protective shoe as directed by your health care provider. If you were not given a protective shoe, wear sturdy, supportive shoes. Your shoes should not pinch your toes and should not fit tightly against your toes.  Do not use any tobacco products, including cigarettes, chewing tobacco, or e-cigarettes. Tobacco can delay bone healing. If you need help quitting, ask your health care provider.  Take medicines only as directed by your health care provider.  Keep all follow-up visits as directed by your health care provider. This is important. SEEK MEDICAL CARE IF:  You have a fever.  Your pain medicine is not helping.  Your toe is cold.  Your toe is numb.  You still have pain after one week of rest and treatment.  You still have pain after your health care provider has said that you can start walking again.  You have pain, tingling, or numbness in your foot that is not going away. SEEK IMMEDIATE MEDICAL CARE IF:  You have severe pain.  You have redness or inflammation in your toe that is getting worse.  You have pain or numbness in your toe that is getting worse.  Your toe turns blue.   This information is not intended to replace advice given to you by your health care provider. Make sure you discuss any questions you have with your health care provider.   Document Released: 05/09/2000 Document Revised: 01/31/2015 Document Reviewed: 03/08/2014 Elsevier Interactive Patient Education [employer].

## 2015-08-12 NOTE — ED Provider Notes (Signed)
CSN: [id number]     Arrival date & time 08/12/15  1450 History  By signing my name below, I, Soijett Blue, attest that this documentation has been prepared under the direction and in the presence of Shaterra Sanzone Y. Madeliene Tejera, PA-C Electronically Signed: Soijett Blue, ED Scribe. 08/12/2015. 3:05 PM.   Chief Complaint  Patient presents with  . Foot Pain     The history is provided by the patient. No language interpreter was used.    Stacy May is a 35 y.o. female who presents to the Emergency Department complaining of 8/10, constant, left foot pain onset 3 days. She notes that she struck her left foot on a wooden chair at the time of the incident. Pt notes that the left foot pain radiates up her left shin. Pt is having associated symptoms of swelling and gait problem due to pain. She notes that she has not tried any medications for the relief of her symptoms. She denies color change, wound, rash, numbness, tingling, and any other symptoms.    Past Medical History  Diagnosis Date  . Brain tumor ([place])   . Seizures ([place])   . PTSD (post-traumatic stress disorder)   . Bipolar 1 disorder ([place])   . Schizoaffective disorder ([place])   . Asthma    No past surgical history on file. No family history on file. Social History  Substance Use Topics  . Smoking status: Current Every Day Smoker  . Smokeless tobacco: Not on file  . Alcohol Use: No   OB History    No data available     Review of Systems  Musculoskeletal: Positive for joint swelling, arthralgias and gait problem (due to pain).  Skin: Negative for color change, rash and wound.  Neurological: Negative for numbness.       No tingling  All other systems reviewed and are negative.     Allergies  Bee venom; Blueberry flavor; Other; Pineapple; and Raspberry  Home Medications   Prior to Admission medications   Medication Sig Start Date End Date Taking? Authorizing Provider  albuterol (PROVENTIL HFA;VENTOLIN HFA) 108 (90 BASE) MCG/ACT  inhaler Inhale 2 puffs into the lungs every 6 (six) hours as needed for wheezing or shortness of breath.    Historical Provider, MD  doxycycline (VIBRAMYCIN) 100 MG capsule Take 1 capsule (100 mg total) by mouth 2 (two) times daily. One po bid x 7 days Patient not taking: Reported on 03/05/2015 02/15/15   Domenic Moras, PA-C  HYDROcodone-acetaminophen (NORCO/VICODIN) 5-325 MG per tablet Take 1 tablet by mouth every 6 (six) hours as needed for moderate pain. 02/17/15   Domenic Moras, PA-C  levETIRAcetam (KEPPRA) 1000 MG tablet Take 1 tablet (1,000 mg total) by mouth 2 (two) times daily. Patient not taking: Reported on 03/05/2015 05/09/14   Sherwood Gambler, MD  predniSONE (DELTASONE) 20 MG tablet Take 1 tablet (20 mg total) by mouth 2 (two) times daily. 01/02/15   Daleen Bo, MD   BP 128/75 mmHg  Pulse 85  Temp(Src) 98.1 F (36.7 C) (Oral)  Resp 18  SpO2 100%  LMP 07/25/2015 Physical Exam  Constitutional: She is oriented to person, place, and time. She appears well-developed and well-nourished. No distress.  HENT:  Head: Normocephalic and atraumatic.  Eyes: EOM are normal.  Neck: Neck supple.  Cardiovascular: Normal rate.   Pulmonary/Chest: Effort normal. No respiratory distress.  Musculoskeletal: Normal range of motion.  Dorsum of foot is mildly generally edematous. There is diffuse tenderness along he medial edge of foot  especially at base of toes, both on the dorsum and plantar surface. Able to wiggle toes. 2 + distal pulses. Brisk cap refill in all 5 toes.   Neurological: She is alert and oriented to person, place, and time.  Skin: Skin is warm and dry.  Psychiatric: She has a normal mood and affect. Her behavior is normal.  Nursing note and vitals reviewed.   ED Course  Procedures (including critical care time) DIAGNOSTIC STUDIES: Oxygen Saturation is 100% on RA, nl by my interpretation.    COORDINATION OF CARE: 3:02 PM Discussed treatment plan with pt at bedside which includes left  foot xray and pt agreed to plan.    Labs Review Labs Reviewed - No data to display  Imaging Review Dg Foot Complete Left  08/12/2015  CLINICAL DATA:  Hit foot on chair 2 days prior with persistent pain and swelling EXAM: LEFT FOOT - COMPLETE 3+ VIEW COMPARISON:  None. FINDINGS: Frontal, oblique, and lateral views were obtained. There is an incomplete obliquely oriented fracture along the lateral aspect of the fourth proximal phalanx. Alignment in this area is anatomic. No other fracture. No dislocation. There is soft tissue swelling over the dorsal distal foot. No appreciable joint space narrowing. There is a minimal posterior calcaneal spur. IMPRESSION: Nondisplaced incomplete fracture lateral aspect fourth proximal phalanx. No other fracture. No dislocation. Soft tissue swelling over the dorsal distal foot. Minimal posterior calcaneal spur. Electronically Signed   By: Lowella Grip III M.D.   On: 08/12/2015 15:55   I have personally reviewed and evaluated these images as part of my medical decision-making.   EKG Interpretation None      MDM   Final diagnoses:  Fracture of fourth toe, left, closed, initial encounter  Contusion of left foot, initial encounter    XR reveals nondisplaced incomplete fracture of lateral fourth proximal phalanx. Pt is neurovascularly intact and no indication for further emergent workup. Toe was buddy taped and pt placed in post op shoe with crutches. REferral given to ortho for f/u as an outpatient. Rx given for pain meds. Encouraged RICE therapy. ER return precautions given.  I personally performed the services described in this documentation, which was scribed in my presence. The recorded information has been reviewed and is accurate.   Anne Ng, PA-C 08/13/15 [place], MD 08/13/15 [phone]

## 2016-03-12 ENCOUNTER — Emergency Department (HOSPITAL_COMMUNITY)
Admission: EM | Admit: 2016-03-12 | Discharge: 2016-03-12 | Disposition: A | Discharge status: Home / Self Care | Payer: Self-pay | Attending: Emergency Medicine | Admitting: Emergency Medicine

## 2016-03-12 ENCOUNTER — Encounter (HOSPITAL_COMMUNITY): Payer: Self-pay | Admitting: *Deleted

## 2016-03-12 DIAGNOSIS — F17200 Nicotine dependence, unspecified, uncomplicated: Secondary | ICD-10-CM | POA: Insufficient documentation

## 2016-03-12 DIAGNOSIS — J45909 Unspecified asthma, uncomplicated: Secondary | ICD-10-CM | POA: Insufficient documentation

## 2016-03-12 DIAGNOSIS — K0889 Other specified disorders of teeth and supporting structures: Secondary | ICD-10-CM | POA: Insufficient documentation

## 2016-03-12 DIAGNOSIS — Z79899 Other long term (current) drug therapy: Secondary | ICD-10-CM | POA: Insufficient documentation

## 2016-03-12 MED ORDER — PENICILLIN V POTASSIUM 500 MG PO TABS
500.0000 mg | ORAL_TABLET | Freq: Four times a day (QID) | ORAL | 0 refills | Status: AC
Start: 2016-03-12 — End: 2016-03-19

## 2016-03-12 MED ORDER — IBUPROFEN 800 MG PO TABS: 800.0000 mg | ORAL_TABLET | Freq: Three times a day (TID) | ORAL | 0 refills | Status: AC

## 2016-03-12 NOTE — Discharge Instructions (Signed)
Take the penicillin as prescribed and be sure to complete the entire course. If you experience rash discontinue the antibiotic and return immediately to the emergency department. Take the ibuprofen as prescribed and be sure to eat with this medication as it can be hard in your stomach. Follow-up with a dentist in the next few days to have your teeth reevaluated. Return immediately to the emergency department if you experience worsening pain, facial swelling, worsening swelling of your gums, sore throat, difficulty swallowing, difficulty opening your mouth, fever, or any other concerning symptoms.

## 2016-03-12 NOTE — ED Provider Notes (Signed)
[facility] DEPT Provider Note   CSN: [id number] Arrival date & time: 03/12/16  1219     History   Chief Complaint Chief Complaint  Patient presents with  . Dental Pain    HPI Stacy May is a 35 y.o. female.  HPI   Patient is a 35 year old female with a history of bipolar 1, schizoaffective disorder, seizures who presents to the emergency department with progressively worsening right upper dental pain for the last 2 days. Patient states she's had chronic pain in this area but is progressively worse over last 2 days. She states the pain is 10/10, sharp, radiates into the right side of her head, worse with food, cold air, cold liquids. She is tried Orajel with little relief. She has taken 800 mg of ibuprofen that has helped some. Associated fever on Sunday of 100.2. No other associated symptoms. Patient denies dizziness, syncope, chills, and all pain, nausea, vomiting, difficulty opening her mouth, sore throat.  Past Medical History:  Diagnosis Date  . Asthma   . Bipolar 1 disorder ([place])   . Brain tumor ([place])   . PTSD (post-traumatic stress disorder)   . Schizoaffective disorder ([place])   . Seizures ([place])     There are no active problems to display for this patient.   History reviewed. No pertinent surgical history.  OB History    No data available       Home Medications    Prior to Admission medications   Medication Sig Start Date End Date Taking? Authorizing Provider  albuterol (PROVENTIL HFA;VENTOLIN HFA) 108 (90 BASE) MCG/ACT inhaler Inhale 2 puffs into the lungs every 6 (six) hours as needed for wheezing or shortness of breath.    Historical Provider, MD  doxycycline (VIBRAMYCIN) 100 MG capsule Take 1 capsule (100 mg total) by mouth 2 (two) times daily. One po bid x 7 days Patient not taking: Reported on 03/05/2015 02/15/15   Domenic Moras, PA-C  HYDROcodone-acetaminophen (NORCO/VICODIN) 5-325 MG per tablet Take 1 tablet by mouth every 6 (six) hours as needed for  moderate pain. 02/17/15   Domenic Moras, PA-C  HYDROcodone-acetaminophen (NORCO/VICODIN) 5-325 MG tablet Take 1 tablet by mouth every 4 (four) hours as needed. 08/12/15   Olivia Canter Sam, PA-C  ibuprofen (ADVIL,MOTRIN) 800 MG tablet Take 1 tablet (800 mg total) by mouth 3 (three) times daily. 03/12/16   Kalman Drape, PA  levETIRAcetam (KEPPRA) 1000 MG tablet Take 1 tablet (1,000 mg total) by mouth 2 (two) times daily. Patient not taking: Reported on 03/05/2015 05/09/14   Sherwood Gambler, MD  naproxen (NAPROSYN) 500 MG tablet Take 1 tablet (500 mg total) by mouth 2 (two) times daily. 08/12/15   Olivia Canter Sam, PA-C  penicillin v potassium (VEETID) 500 MG tablet Take 1 tablet (500 mg total) by mouth 4 (four) times daily. 03/12/16 03/19/16  Kalman Drape, PA  predniSONE (DELTASONE) 20 MG tablet Take 1 tablet (20 mg total) by mouth 2 (two) times daily. 01/02/15   Daleen Bo, MD    Family History No family history on file.  Social History Social History  Substance Use Topics  . Smoking status: Current Every Day Smoker  . Smokeless tobacco: Never Used  . Alcohol use No     Allergies   Bee venom; Blueberry flavor; Other; Pineapple; and Raspberry   Review of Systems Review of Systems  Constitutional: Positive for fever. Negative for chills.  HENT: Positive for dental problem. Negative for ear pain, sore throat and trouble swallowing.  Respiratory: Negative for cough and shortness of breath.   Cardiovascular: Negative for chest pain.  Gastrointestinal: Negative for abdominal pain, nausea and vomiting.     Physical Exam Updated Vital Signs BP 128/87 (BP Location: Right Arm)   Pulse 72   Temp 98.2 F (36.8 C) (Oral)   Resp 16   Wt 102.3 kg   LMP 02/11/2016   SpO2 98%   BMI 38.11 kg/m   Physical Exam  Constitutional: She appears well-developed and well-nourished. No distress.  HENT:  Head: Normocephalic and atraumatic.  Mouth/Throat: Uvula is midline, oropharynx is clear and moist  and mucous membranes are normal. No trismus in the jaw. Dental caries present. No dental abscesses or uvula swelling.    Mild erythema and edema noted to the gumline of posterior right upper jaw, mild TTP, no abscess noted, no area of fluctuance, no trismus.   Eyes: Conjunctivae are normal.  Cardiovascular: Normal rate and regular rhythm.   Pulses:      Radial pulses are 2+ on the right side, and 2+ on the left side.  Pulmonary/Chest: Effort normal. No respiratory distress.  Musculoskeletal: Normal range of motion.  Neurological: She is alert. Coordination normal.  Skin: Skin is warm and dry. She is not diaphoretic.  Psychiatric: She has a normal mood and affect. Her behavior is normal.  Nursing note and vitals reviewed.    ED Treatments / Results  Labs (all labs ordered are listed, but only abnormal results are displayed) Labs Reviewed - No data to display  EKG  EKG Interpretation None       Radiology No results found.  Procedures Procedures (including critical care time)  Medications Ordered in ED Medications - No data to display   Initial Impression / Assessment and Plan / ED Course  I have reviewed the triage vital signs and the nursing notes.  Pertinent labs & imaging results that were available during my care of the patient were reviewed by me and considered in my medical decision making (see chart for details).  Clinical Course    Patient with dentalgia.  No abscess requiring immediate incision and drainage.  Exam not concerning for Ludwig's angina or pharyngeal abscess.  Patient afebrile, VSS, no acute distress. Will treat with penicillin and ibuprofen. Pt instructed to follow-up with dentist.  Discussed return precautions. Pt safe for discharge.   Final Clinical Impressions(s) / ED Diagnoses   Final diagnoses:  Pain, dental    New Prescriptions New Prescriptions   IBUPROFEN (ADVIL,MOTRIN) 800 MG TABLET    Take 1 tablet (800 mg total) by mouth 3  (three) times daily.   PENICILLIN V POTASSIUM (VEETID) 500 MG TABLET    Take 1 tablet (500 mg total) by mouth 4 (four) times daily.     Kalman Drape, PA 03/12/16 1406    Blanchie Dessert, MD 03/13/16 2207

## 2016-03-12 NOTE — ED Triage Notes (Signed)
Pt reports dental pain that started 3 days ago. Pt reports pain in rt upper molars.

## 2018-09-24 ENCOUNTER — Emergency Department (HOSPITAL_COMMUNITY): Payer: No Typology Code available for payment source

## 2018-09-24 ENCOUNTER — Encounter (HOSPITAL_COMMUNITY): Payer: Self-pay

## 2018-09-24 ENCOUNTER — Emergency Department (HOSPITAL_COMMUNITY)
Admission: EM | Admit: 2018-09-24 | Discharge: 2018-09-24 | Disposition: A | Discharge status: Home / Self Care | Payer: No Typology Code available for payment source | Attending: Emergency Medicine | Admitting: Emergency Medicine

## 2018-09-24 ENCOUNTER — Other Ambulatory Visit: Payer: Self-pay

## 2018-09-24 DIAGNOSIS — Y999 Unspecified external cause status: Secondary | ICD-10-CM | POA: Diagnosis not present

## 2018-09-24 DIAGNOSIS — R079 Chest pain, unspecified: Secondary | ICD-10-CM | POA: Diagnosis not present

## 2018-09-24 DIAGNOSIS — Y929 Unspecified place or not applicable: Secondary | ICD-10-CM | POA: Diagnosis not present

## 2018-09-24 DIAGNOSIS — J45909 Unspecified asthma, uncomplicated: Secondary | ICD-10-CM | POA: Insufficient documentation

## 2018-09-24 DIAGNOSIS — S80212A Abrasion, left knee, initial encounter: Secondary | ICD-10-CM | POA: Insufficient documentation

## 2018-09-24 DIAGNOSIS — F17200 Nicotine dependence, unspecified, uncomplicated: Secondary | ICD-10-CM | POA: Diagnosis not present

## 2018-09-24 DIAGNOSIS — Z79899 Other long term (current) drug therapy: Secondary | ICD-10-CM | POA: Diagnosis not present

## 2018-09-24 DIAGNOSIS — Z85841 Personal history of malignant neoplasm of brain: Secondary | ICD-10-CM | POA: Insufficient documentation

## 2018-09-24 DIAGNOSIS — M25562 Pain in left knee: Secondary | ICD-10-CM

## 2018-09-24 DIAGNOSIS — Y939 Activity, unspecified: Secondary | ICD-10-CM | POA: Insufficient documentation

## 2018-09-24 LAB — CBC WITH DIFFERENTIAL/PLATELET
Abs Immature Granulocytes: 0.02 10*3/uL (ref 0.00–0.07)
Basophils Absolute: 0 10*3/uL (ref 0.0–0.1)
Basophils Relative: 1 %
Eosinophils Absolute: 0.2 10*3/uL (ref 0.0–0.5)
Eosinophils Relative: 2 %
HCT: 39.4 % (ref 36.0–46.0)
Hemoglobin: 12.4 g/dL (ref 12.0–15.0)
Immature Granulocytes: 0 %
Lymphocytes Relative: 36 %
Lymphs Abs: 2.4 10*3/uL (ref 0.7–4.0)
MCH: 27.5 pg (ref 26.0–34.0)
MCHC: 31.5 g/dL (ref 30.0–36.0)
MCV: 87.4 fL (ref 80.0–100.0)
Monocytes Absolute: 0.5 10*3/uL (ref 0.1–1.0)
Monocytes Relative: 8 %
Neutro Abs: 3.5 10*3/uL (ref 1.7–7.7)
Neutrophils Relative %: 53 %
Platelets: 478 10*3/uL — ABNORMAL HIGH (ref 150–400)
RBC: 4.51 MIL/uL (ref 3.87–5.11)
RDW: 16.7 % — ABNORMAL HIGH (ref 11.5–15.5)
WBC: 6.7 10*3/uL (ref 4.0–10.5)
nRBC: 0 % (ref 0.0–0.2)

## 2018-09-24 LAB — I-STAT BETA HCG BLOOD, ED (MC, WL, AP ONLY): I-stat hCG, quantitative: <5 m[IU]/mL (ref <5)

## 2018-09-24 LAB — COMPREHENSIVE METABOLIC PANEL
ALT: 13 U/L (ref 0–44)
AST: 19 U/L (ref 15–41)
Albumin: 4 g/dL (ref 3.5–5.0)
Alkaline Phosphatase: 138 U/L — ABNORMAL HIGH (ref 38–126)
Anion gap: 8 (ref 5–15)
BUN: 8 mg/dL (ref 6–20)
CO2: 24 mmol/L (ref 22–32)
Calcium: 9 mg/dL (ref 8.9–10.3)
Chloride: 105 mmol/L (ref 98–111)
Creatinine, Ser: 0.57 mg/dL (ref 0.44–1.00)
GFR calc Af Amer: >60 mL/min (ref >60)
GFR calc non Af Amer: >60 mL/min (ref >60)
Glucose, Bld: 76 mg/dL (ref 70–99)
Potassium: 4.1 mmol/L (ref 3.5–5.1)
Sodium: 137 mmol/L (ref 135–145)
Total Bilirubin: 0.6 mg/dL (ref 0.3–1.2)
Total Protein: 8.1 g/dL (ref 6.5–8.1)

## 2018-09-24 LAB — LIPASE, BLOOD: Lipase: 26 U/L (ref 11–51)

## 2018-09-24 MED ORDER — MORPHINE SULFATE (PF) 4 MG/ML IV SOLN
4.0000 mg | Freq: Once | INTRAVENOUS | Status: AC
  Administered 2018-09-24: 11:00:00 4 mg via INTRAVENOUS
  Filled 2018-09-24: qty 1

## 2018-09-24 MED ORDER — OXYCODONE-ACETAMINOPHEN 5-325 MG PO TABS: 1.0000 | ORAL_TABLET | ORAL | 0 refills | Status: AC | PRN
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
## 2018-09-24 NOTE — Discharge Instructions (Signed)
Your work-up today showed no fracture dislocation.  There was no evidence of rib fractures however we suspect you have overlying soft tissue injury.  Your knee x-ray also did not show any fracture dislocation but we are concerned about soft tissue injury.  Please use the knee immobilizer while it hurts and use crutches to minimize weightbearing.  Please rest and stay hydrated for the neck several days and use the pain medicines he can take deep breaths.  Please follow-up with a PCP.  If any symptoms change or worsen, please return to the nearest emergency department

## 2018-09-24 NOTE — ED Notes (Signed)
Failed attempt to collect labs   
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
## 2018-09-24 NOTE — ED Notes (Signed)
Unsuccessful IV attempt x 2. OK per provider to give medication IM, and have phlebotomy retreive labs.

## 2019-03-24 ENCOUNTER — Emergency Department (HOSPITAL_COMMUNITY)
Admission: EM | Admit: 2019-03-24 | Discharge: 2019-03-24 | Disposition: A | Discharge status: Home / Self Care | Payer: Self-pay | Attending: Emergency Medicine | Admitting: Emergency Medicine

## 2019-03-24 ENCOUNTER — Emergency Department (HOSPITAL_COMMUNITY): Payer: Self-pay

## 2019-03-24 DIAGNOSIS — Z79899 Other long term (current) drug therapy: Secondary | ICD-10-CM | POA: Insufficient documentation

## 2019-03-24 DIAGNOSIS — F17210 Nicotine dependence, cigarettes, uncomplicated: Secondary | ICD-10-CM | POA: Insufficient documentation

## 2019-03-24 DIAGNOSIS — J45909 Unspecified asthma, uncomplicated: Secondary | ICD-10-CM | POA: Insufficient documentation

## 2019-03-24 DIAGNOSIS — Z20828 Contact with and (suspected) exposure to other viral communicable diseases: Secondary | ICD-10-CM | POA: Insufficient documentation

## 2019-03-24 DIAGNOSIS — J069 Acute upper respiratory infection, unspecified: Secondary | ICD-10-CM | POA: Insufficient documentation

## 2019-03-24 MED ORDER — ALBUTEROL SULFATE HFA 108 (90 BASE) MCG/ACT IN AERS
2.0000 | INHALATION_SPRAY | Freq: Once | RESPIRATORY_TRACT | Status: DC
  Filled 2019-03-24: qty 6.7

## 2019-03-24 NOTE — ED Provider Notes (Signed)
[facility] EMERGENCY DEPARTMENT Provider Note   CSN: [id number] Arrival date & time: 03/24/19  1101     History   Chief Complaint Chief Complaint  Patient presents with  . Cough    HPI Stacy May is a 38 y.o. female with past medical history of asthma, presenting to the emergency department with about 1 week of cough.  Patient states cough is mostly dry though sometimes she has a yellow phlegm.  She endorses associated sore throat and nasal congestion.  No fevers, difficulty breathing or swallowing, ear pain, or abdominal complaints.  No changes in her taste or smell.  She states she feels as though she has a common cold.  She does not have health insurance and therefore does not have an inhaler at this time. No known COVID contacts.     The history is provided by the patient.    Past Medical History:  Diagnosis Date  . Asthma   . Bipolar 1 disorder ([place])   . Brain tumor ([place])   . PTSD (post-traumatic stress disorder)   . Schizoaffective disorder ([place])   . Seizures ([place])     There are no active problems to display for this patient.   No past surgical history on file.   OB History   No obstetric history on file.      Home Medications    Prior to Admission medications   Medication Sig Start Date End Date Taking? Authorizing Provider  aspirin-acetaminophen-caffeine (EXCEDRIN MIGRAINE) [phone] MG tablet Take 2 tablets by mouth every 6 (six) hours as needed for headache.    [provider]  Aspirin-Salicylamide-Caffeine (BC HEADACHE POWDER PO) Take 1 packet by mouth as needed (headache/pain).    [provider]  doxycycline (VIBRAMYCIN) 100 MG capsule Take 1 capsule (100 mg total) by mouth 2 (two) times daily. One po bid x 7 days Patient not taking: Reported on 03/05/2015 02/15/15   Domenic Moras, PA-C  HYDROcodone-acetaminophen (NORCO/VICODIN) 5-325 MG per tablet Take 1 tablet by mouth every 6 (six) hours as needed for moderate  pain. Patient not taking: Reported on 09/24/2018 02/17/15   Domenic Moras, PA-C  HYDROcodone-acetaminophen (NORCO/VICODIN) 5-325 MG tablet Take 1 tablet by mouth every 4 (four) hours as needed. Patient not taking: Reported on 09/24/2018 08/12/15   Sam, Olivia Canter, PA-C  ibuprofen (ADVIL,MOTRIN) 800 MG tablet Take 1 tablet (800 mg total) by mouth 3 (three) times daily. Patient not taking: Reported on 09/24/2018 03/12/16   Kalman Drape, PA  levETIRAcetam (KEPPRA) 1000 MG tablet Take 1 tablet (1,000 mg total) by mouth 2 (two) times daily. Patient not taking: Reported on 03/05/2015 05/09/14   Sherwood Gambler, MD  naproxen (NAPROSYN) 500 MG tablet Take 1 tablet (500 mg total) by mouth 2 (two) times daily. Patient not taking: Reported on 09/24/2018 08/12/15   Sam, Olivia Canter, PA-C  oxyCODONE-acetaminophen (PERCOCET/ROXICET) 5-325 MG tablet Take 1 tablet by mouth every 4 (four) hours as needed for severe pain. 09/24/18   Tegeler, Gwenyth Allegra, MD  predniSONE (DELTASONE) 20 MG tablet Take 1 tablet (20 mg total) by mouth 2 (two) times daily. Patient not taking: Reported on 09/24/2018 01/02/15   Daleen Bo, MD  Vitamin D, Ergocalciferol, (DRISDOL) 1.25 MG (50000 UT) CAPS capsule Take 50,000 Units by mouth every 7 (seven) days.    [provider]    Family History No family history on file.  Social History Social History   Tobacco Use  . Smoking status: Current Every  Day Smoker    Packs/day: 0.50    Types: Cigarettes  . Smokeless tobacco: Never Used  Substance Use Topics  . Alcohol use: No  . Drug use: Not Currently     Allergies   Bee venom, Blueberry flavor, Other, Pineapple, and Raspberry   Review of Systems Review of Systems  All other systems reviewed and are negative.    Physical Exam Updated Vital Signs BP 119/82 (BP Location: Right Arm)   Pulse 87   Temp 98 F (36.7 C) (Oral)   Resp 18   LMP 03/08/2019   SpO2 97%   Physical Exam Vitals signs and nursing note reviewed.   Constitutional:      General: She is not in acute distress.    Appearance: She is well-developed. She is not ill-appearing.  HENT:     Head: Normocephalic and atraumatic.     Mouth/Throat:     Mouth: Mucous membranes are moist.     Pharynx: Posterior oropharyngeal erythema present. No oropharyngeal exudate.     Comments: Mild posterior pharyngeal erythema, no exudates.  No uvula swelling, uvula midline.  No trismus. Eyes:     Conjunctiva/sclera: Conjunctivae normal.  Neck:     Musculoskeletal: Normal range of motion and neck supple. No muscular tenderness.  Cardiovascular:     Rate and Rhythm: Normal rate and regular rhythm.  Pulmonary:     Effort: Pulmonary effort is normal. No respiratory distress.     Breath sounds: Normal breath sounds.  Abdominal:     Palpations: Abdomen is soft.  Lymphadenopathy:     Cervical: No cervical adenopathy.  Skin:    General: Skin is warm.  Neurological:     Mental Status: She is alert.  Psychiatric:        Behavior: Behavior normal.      ED Treatments / Results  Labs (all labs ordered are listed, but only abnormal results are displayed) Labs Reviewed  NOVEL CORONAVIRUS, NAA (HOSP ORDER, SEND-OUT TO REF LAB; TAT 18-24 HRS)    EKG None  Radiology Dg Chest 2 View  Result Date: 03/24/2019 CLINICAL DATA:  Cough and sore throat EXAM: CHEST - 2 VIEW COMPARISON:  Sep 24, 2018 FINDINGS: Lungs are clear. Heart is upper normal in size with pulmonary vascularity normal. No adenopathy. No bone lesions. IMPRESSION: No edema or consolidation.  Heart upper normal in size. Electronically Signed   By: Lowella Grip III M.D.   On: 03/24/2019 11:55    Procedures Procedures (including critical care time)  Medications Ordered in ED Medications  albuterol (VENTOLIN HFA) 108 (90 Base) MCG/ACT inhaler 2 puff (has no administration in time range)     Initial Impression / Assessment and Plan / ED Course  I have reviewed the triage vital signs and  the nursing notes.  Pertinent labs & imaging results that were available during my care of the patient were reviewed by me and considered in my medical decision making (see chart for details).    Stacy May was evaluated in Emergency Department on 03/24/2019 for the symptoms described in the history of present illness. She was evaluated in the context of the global COVID-19 pandemic, which necessitated consideration that the patient might be at risk for infection with the SARS-CoV-2 virus that causes COVID-19. Institutional protocols and algorithms that pertain to the evaluation of patients at risk for COVID-19 are in a state of rapid change based on information released by regulatory bodies including the CDC and federal and state organizations.  These policies and algorithms were followed during the patient's care in the ED.     Patients symptoms are consistent with URI, likely viral etiology. Afebrile, tolerating secretions.  Lungs clear to auscultation bilaterally. CXR negative for acute infiltrate.  Discussed that antibiotics are not indicated for viral infections. Pt will be discharged with symptomatic treatment. Albuterol inhaler provided. COVID test sent. Verbalizes understanding and is agreeable with plan. Pt is hemodynamically stable & in NAD prior to dc.  Discussed results, findings, treatment and follow up. Patient advised of return precautions. Patient verbalized understanding and agreed with plan.  Final Clinical Impressions(s) / ED Diagnoses   Final diagnoses:  Viral URI with cough    ED Discharge Orders    None       , [place] N, PA-C 03/24/19 1258    Carmin Muskrat, MD 03/24/19 [phone]

## 2019-03-24 NOTE — Discharge Instructions (Signed)
Please read instructions below.  You can take tylenol or ibuprofen as needed for sore throat or fever.  Drink plenty of water.  Use saline nasal spray for congestion. Use your albuterol every 4-6 hours as needed for shortness of breath or wheezing. Follow up with your primary care provider as needed.  Return to the ER for inability to swallow liquids, difficulty breathing, or new or worsening symptoms.

## 2019-03-24 NOTE — ED Notes (Signed)
Reports has been tested twice for COVID with negative results.

## 2019-03-24 NOTE — ED Notes (Signed)
Patient verbalizes understanding of discharge instructions. Opportunity for questioning and answers were provided. Armband removed by staff, pt discharged from ED. Ambulated out to lobby  

## 2019-03-24 NOTE — ED Triage Notes (Signed)
Pt to ER for evaluation of cough x 1 week. Reports productive with yellow sputum. No fevers. NAD.

## 2019-03-25 LAB — NOVEL CORONAVIRUS, NAA (HOSP ORDER, SEND-OUT TO REF LAB; TAT 18-24 HRS): SARS-CoV-2, NAA: NOT DETECTED

## 2020-02-17 ENCOUNTER — Other Ambulatory Visit: Payer: Self-pay

## 2020-02-17 ENCOUNTER — Encounter (HOSPITAL_COMMUNITY): Payer: Self-pay | Admitting: Emergency Medicine

## 2020-02-17 ENCOUNTER — Telehealth: Payer: Self-pay | Admitting: Infectious Diseases

## 2020-02-17 ENCOUNTER — Other Ambulatory Visit: Payer: Self-pay | Admitting: Infectious Diseases

## 2020-02-17 ENCOUNTER — Emergency Department (HOSPITAL_COMMUNITY)
Admission: EM | Admit: 2020-02-17 | Discharge: 2020-02-17 | Disposition: A | Discharge status: Home / Self Care | Payer: HRSA Program | Attending: Emergency Medicine | Admitting: Emergency Medicine

## 2020-02-17 DIAGNOSIS — U071 COVID-19: Secondary | ICD-10-CM | POA: Diagnosis not present

## 2020-02-17 DIAGNOSIS — E669 Obesity, unspecified: Secondary | ICD-10-CM

## 2020-02-17 DIAGNOSIS — J45909 Unspecified asthma, uncomplicated: Secondary | ICD-10-CM | POA: Insufficient documentation

## 2020-02-17 DIAGNOSIS — R05 Cough: Secondary | ICD-10-CM | POA: Diagnosis present

## 2020-02-17 DIAGNOSIS — F17210 Nicotine dependence, cigarettes, uncomplicated: Secondary | ICD-10-CM | POA: Diagnosis not present

## 2020-02-17 NOTE — Telephone Encounter (Signed)
Called to Discuss with patient about Covid symptoms and the use of the monoclonal antibody infusion for those with mild to moderate Covid symptoms and at a high risk of hospitalization.     Pt appears to qualify for this infusion due to co-morbid conditions and/or a member of an at-risk group in accordance with the FDA Emergency Use Authorization.    Trying to consent and set up transportation for Saturday AM - this is the last day patient is eligible for treatment
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
## 2020-02-17 NOTE — Progress Notes (Signed)
I discussed with the patient's ER provider at the time of the ED visit patient's candidacy for monoclonal antibody therapy.   This patient is a 39 y.o. female that meets the FDA criteria for Emergency Use Authorization of COVID monoclonal antibody casirivimab/imdevimab or bamlanivimab/eteseviamb.  Has a (+) direct SARS-CoV-2 viral test result  Has mild or moderate COVID-19   Is NOT hospitalized due to COVID-19  Is within 10 days of symptom onset  Has at least one of the high risk factor(s) for progression to severe COVID-19 and/or hospitalization as defined in EUA.  Specific high risk criteria : BMI > 25   I have spoken and communicated the following to the patient or parent/caregiver regarding COVID monoclonal antibody treatment:  1. FDA has authorized the emergency use for the treatment of mild to moderate COVID-19 in adults and pediatric patients with positive results of direct SARS-CoV-2 viral testing who are 2 years of age and older weighing at least 40 kg, and who are at high risk for progressing to severe COVID-19 and/or hospitalization.  2. The significant known and potential risks and benefits of COVID monoclonal antibody, and the extent to which such potential risks and benefits are unknown.  3. Information on available alternative treatments and the risks and benefits of those alternatives, including clinical trials.  4. Patients treated with COVID monoclonal antibody should continue to self-isolate and use infection control measures (e.g., wear mask, isolate, social distance, avoid sharing personal items, clean and disinfect "high touch" surfaces, and frequent handwashing) according to CDC guidelines.   5. The patient or parent/caregiver has the option to accept or refuse COVID monoclonal antibody treatment.  After reviewing this information with the patient, the patient has agreed to receive one of the available covid 19 monoclonal antibodies and will be provided an  appropriate fact sheet prior to infusion. Janene Madeira, NP 02/17/2020 8:33 PM

## 2020-02-17 NOTE — ED Provider Notes (Signed)
Stacy May   CSN: [id number] Arrival date & time: 02/17/20  3016     History Chief Complaint  Patient presents with  . Covid Positive  . Cough    Stacy May is a 39 y.o. female.  39 year old female with complaint of cough, shortness of breath, loss of sense of smell and taste as well as body aches. Patient reports onset of symptoms 02/08/20 after exposure at work. Patient was tested at work on 02/13/20 with a saliva test (per image on phone) and was positive for COVID. Patient states her symptoms are not getting better, came to the ER for treatment, states a friend was treated with antibiotics.   Stacy May was evaluated in Emergency Department on 02/17/2020 for the symptoms described in the history of present illness. She was evaluated in the context of the global COVID-19 pandemic, which necessitated consideration that the patient might be at risk for infection with the SARS-CoV-2 virus that causes COVID-19. Institutional protocols and algorithms that pertain to the evaluation of patients at risk for COVID-19 are in a state of rapid change based on information released by regulatory bodies including the CDC and federal and state organizations. These policies and algorithms were followed during the patient's care in the ED.         Past Medical History:  Diagnosis Date  . Asthma   . Bipolar 1 disorder ([place])   . Brain tumor ([place])   . PTSD (post-traumatic stress disorder)   . Schizoaffective disorder ([place])   . Seizures ([place])     There are no problems to display for this patient.   History reviewed. No pertinent surgical history.   OB History   No obstetric history on file.     No family history on file.  Social History   Tobacco Use  . Smoking status: Current Every Day Smoker    Packs/day: 0.50    Types: Cigarettes  . Smokeless tobacco: Never Used  Vaping Use  . Vaping Use: Never used  Substance Use Topics    . Alcohol use: No  . Drug use: Not Currently    Home Medications Prior to Admission medications   Medication Sig Start Date End Date Taking? Authorizing Provider  aspirin-acetaminophen-caffeine (EXCEDRIN MIGRAINE) [phone] MG tablet Take 2 tablets by mouth every 6 (six) hours as needed for headache.    [provider]  Aspirin-Salicylamide-Caffeine (BC HEADACHE POWDER PO) Take 1 packet by mouth as needed (headache/pain).    [provider]  doxycycline (VIBRAMYCIN) 100 MG capsule Take 1 capsule (100 mg total) by mouth 2 (two) times daily. One po bid x 7 days Patient not taking: Reported on 03/05/2015 02/15/15   Domenic Moras, PA-C  HYDROcodone-acetaminophen (NORCO/VICODIN) 5-325 MG per tablet Take 1 tablet by mouth every 6 (six) hours as needed for moderate pain. Patient not taking: Reported on 09/24/2018 02/17/15   Domenic Moras, PA-C  HYDROcodone-acetaminophen (NORCO/VICODIN) 5-325 MG tablet Take 1 tablet by mouth every 4 (four) hours as needed. Patient not taking: Reported on 09/24/2018 08/12/15   Sam, Olivia Canter, PA-C  ibuprofen (ADVIL,MOTRIN) 800 MG tablet Take 1 tablet (800 mg total) by mouth 3 (three) times daily. Patient not taking: Reported on 09/24/2018 03/12/16   Kalman Drape, PA  levETIRAcetam (KEPPRA) 1000 MG tablet Take 1 tablet (1,000 mg total) by mouth 2 (two) times daily. Patient not taking: Reported on 03/05/2015 05/09/14   Sherwood Gambler, MD  naproxen (NAPROSYN) 500  MG tablet Take 1 tablet (500 mg total) by mouth 2 (two) times daily. Patient not taking: Reported on 09/24/2018 08/12/15   Sam, Olivia Canter, PA-C  oxyCODONE-acetaminophen (PERCOCET/ROXICET) 5-325 MG tablet Take 1 tablet by mouth every 4 (four) hours as needed for severe pain. 09/24/18   Tegeler, Gwenyth Allegra, MD  predniSONE (DELTASONE) 20 MG tablet Take 1 tablet (20 mg total) by mouth 2 (two) times daily. Patient not taking: Reported on 09/24/2018 01/02/15   Daleen Bo, MD  Vitamin D, Ergocalciferol,  (DRISDOL) 1.25 MG (50000 UT) CAPS capsule Take 50,000 Units by mouth every 7 (seven) days.    [provider]    Allergies    Bee venom, Blueberry flavor, Other, Pineapple, and Raspberry  Review of Systems   Review of Systems  Constitutional: Negative for chills and fever.  HENT: Positive for congestion. Negative for sore throat.   Respiratory: Positive for cough and shortness of breath.   Gastrointestinal: Negative for abdominal pain, nausea and vomiting.  Musculoskeletal: Positive for arthralgias and myalgias.  Skin: Negative for rash and wound.  Allergic/Immunologic: Negative for immunocompromised state.  Neurological: Negative for weakness and headaches.  Hematological: Negative for adenopathy.  All other systems reviewed and are negative.   Physical Exam Updated Vital Signs BP (!) 125/95   Pulse 84   Temp 98.9 F (37.2 C) (Oral)   Resp 18   SpO2 100%   Physical Exam Vitals and nursing May reviewed.  Constitutional:      General: She is not in acute distress.    Appearance: She is well-developed. She is not diaphoretic.  HENT:     Head: Normocephalic and atraumatic.  Cardiovascular:     Rate and Rhythm: Normal rate and regular rhythm.     Pulses: Normal pulses.     Heart sounds: Normal heart sounds.  Pulmonary:     Effort: Pulmonary effort is normal.     Breath sounds: Normal breath sounds.  Musculoskeletal:     Cervical back: Neck supple.  Lymphadenopathy:     Cervical: No cervical adenopathy.  Skin:    General: Skin is warm and dry.     Findings: No erythema or rash.  Neurological:     Mental Status: She is alert and oriented to person, place, and time.  Psychiatric:        Behavior: Behavior normal.     ED Results / Procedures / Treatments   Labs (all labs ordered are listed, but only abnormal results are displayed) Labs Reviewed - No data to display  EKG None  Radiology No results found.  Procedures Procedures (including critical  care time)  Medications Ordered in ED Medications - No data to display  ED Course  I have reviewed the triage vital signs and the nursing notes.  Pertinent labs & imaging results that were available during my care of the patient were reviewed by me and considered in my medical decision making (see chart for details).  Clinical Course as of Feb 17 1107  Fri Sep 24, 267  6223 39 year old female, COVID + on day 9 of symptoms and not feeling better. On exam, well appearing, in no distress. Vitals reassuring. Not vaccinated. History of asthma, tobacco use, overweight. Discussed antibody infusion, patient is interested. [facility] to contact patient. Patient dc, follow up with infusion clinic, see PCP.    [LM]    Clinical Course User Index [LM] Roque Lias   MDM Rules/Calculators/A&P  Final Clinical Impression(s) / ED Diagnoses Final diagnoses:  [id number]    Rx / DC Orders ED Discharge Orders    None       Tacy Learn, PA-C 02/17/20 1108    Dorie Rank, MD 02/17/20 [phone]

## 2020-02-17 NOTE — Discharge Instructions (Addendum)
Follow up with the infusion clinic. They will contact you shortly to discuss antibody infusion treatment.

## 2020-02-18 ENCOUNTER — Ambulatory Visit (HOSPITAL_COMMUNITY)
Admission: RE | Admit: 2020-02-18 | Discharge: 2020-02-18 | Disposition: A | Discharge status: Home / Self Care | Payer: HRSA Program | Source: Ambulatory Visit | Attending: Pulmonary Disease | Admitting: Pulmonary Disease

## 2020-02-18 DIAGNOSIS — U071 COVID-19: Secondary | ICD-10-CM | POA: Diagnosis not present

## 2020-02-18 DIAGNOSIS — E669 Obesity, unspecified: Secondary | ICD-10-CM | POA: Insufficient documentation

## 2020-02-18 MED ORDER — EPINEPHRINE 0.3 MG/0.3ML IJ SOAJ: 0.3000 mg | Freq: Once | INTRAMUSCULAR | Status: DC | PRN

## 2020-02-18 MED ORDER — ALBUTEROL SULFATE HFA 108 (90 BASE) MCG/ACT IN AERS: 2.0000 | INHALATION_SPRAY | Freq: Once | RESPIRATORY_TRACT | Status: DC | PRN

## 2020-02-18 MED ORDER — DIPHENHYDRAMINE HCL 50 MG/ML IJ SOLN: 50.0000 mg | Freq: Once | INTRAMUSCULAR | Status: DC | PRN

## 2020-02-18 MED ORDER — SODIUM CHLORIDE 0.9 % IV SOLN
1200.0000 mg | Freq: Once | INTRAVENOUS | Status: AC
  Administered 2020-02-18: 1200 mg via INTRAVENOUS
  Filled 2020-02-18: qty 10

## 2020-02-18 MED ORDER — METHYLPREDNISOLONE SODIUM SUCC 125 MG IJ SOLR: 125.0000 mg | Freq: Once | INTRAMUSCULAR | Status: DC | PRN

## 2020-02-18 MED ORDER — FAMOTIDINE IN NACL 20-0.9 MG/50ML-% IV SOLN: 20.0000 mg | Freq: Once | INTRAVENOUS | Status: DC | PRN

## 2020-02-18 MED ORDER — SODIUM CHLORIDE 0.9 % IV SOLN: INTRAVENOUS | Status: DC | PRN

## 2020-02-18 NOTE — Progress Notes (Addendum)
  Diagnosis: COVID-19  Physician: Dr. Joya Gaskins  Procedure: Covid Infusion Clinic Med: casirivimab\imdevimab infusion - Provided patient with casirivimab\imdevimab fact sheet for patients, parents and caregivers prior to infusion.  Complications: No immediate complications noted.  Discharge: Discharged home   Cheri Guppy 02/18/2020

## 2020-02-18 NOTE — Discharge Instructions (Signed)

## 2020-07-20 ENCOUNTER — Encounter: Payer: Self-pay | Admitting: Family

## 2020-07-20 ENCOUNTER — Emergency Department (HOSPITAL_COMMUNITY): Payer: Self-pay

## 2020-07-20 ENCOUNTER — Emergency Department (HOSPITAL_COMMUNITY)
Admission: EM | Admit: 2020-07-20 | Discharge: 2020-07-20 | Disposition: A | Discharge status: Home / Self Care | Payer: Self-pay | Attending: Emergency Medicine | Admitting: Emergency Medicine

## 2020-07-20 ENCOUNTER — Other Ambulatory Visit: Payer: Self-pay

## 2020-07-20 DIAGNOSIS — F17210 Nicotine dependence, cigarettes, uncomplicated: Secondary | ICD-10-CM | POA: Insufficient documentation

## 2020-07-20 DIAGNOSIS — M79602 Pain in left arm: Secondary | ICD-10-CM | POA: Insufficient documentation

## 2020-07-20 DIAGNOSIS — J45909 Unspecified asthma, uncomplicated: Secondary | ICD-10-CM | POA: Insufficient documentation

## 2020-07-20 DIAGNOSIS — Z7982 Long term (current) use of aspirin: Secondary | ICD-10-CM | POA: Insufficient documentation

## 2020-07-20 MED ORDER — PREDNISONE 20 MG PO TABS: 20.0000 mg | ORAL_TABLET | Freq: Every day | ORAL | 0 refills | Status: AC

## 2020-07-20 MED ORDER — KETOROLAC TROMETHAMINE 60 MG/2ML IM SOLN: 15.0000 mg | Freq: Once | INTRAMUSCULAR | Status: DC

## 2020-07-20 NOTE — ED Provider Notes (Signed)
[facility] EMERGENCY DEPARTMENT Provider Note   CSN: [id number] Arrival date & time: 07/20/20  1255     History Chief Complaint  Patient presents with  . Arm Pain    Stacy May is a 40 y.o. female.  HPI   Patient with no significant medical history presents to the emergency department with chief complaint of left forearm pain.  Patient endorses pain started approximately 2 days ago.  She describes the pain as a sharp sensation which starts at her elbow and radiates out into her hand, she endorses associated paresthesias but denies  weakness to the area.  She states that she works at [employer] and lifts up heavy boxes daily, she states 2 days ago she lifted a heavy box and felt something pop in her forearm.  She had pain then but continued to work.  Today she was unable to move her hand without having severe pain.  She denies any other trauma to the area.  Patient has tried over-the-counter pain medication without relief.  Patient denies headaches, fevers, chills, shortness of breath, chest pain, abdominal pain, nausea, vomiting, diarrhea, worsening pedal edema.  Past Medical History:  Diagnosis Date  . Asthma   . Bipolar 1 disorder ([place])   . Brain tumor ([place])   . PTSD (post-traumatic stress disorder)   . Schizoaffective disorder ([place])   . Seizures ([place])     There are no problems to display for this patient.   No past surgical history on file.   OB History   No obstetric history on file.     No family history on file.  Social History   Tobacco Use  . Smoking status: Current Every Day Smoker    Packs/day: 0.50    Types: Cigarettes  . Smokeless tobacco: Never Used  Vaping Use  . Vaping Use: Never used  Substance Use Topics  . Alcohol use: No  . Drug use: Not Currently    Home Medications Prior to Admission medications   Medication Sig Start Date End Date Taking? Authorizing Provider  predniSONE (DELTASONE) 20 MG tablet Take 1 tablet  (20 mg total) by mouth daily for 5 days. 07/20/20 07/25/20 Yes Marcello Fennel, PA-C  aspirin-acetaminophen-caffeine (EXCEDRIN MIGRAINE) [phone] MG tablet Take 2 tablets by mouth every 6 (six) hours as needed for headache.    [provider]  Aspirin-Salicylamide-Caffeine (BC HEADACHE POWDER PO) Take 1 packet by mouth as needed (headache/pain).    [provider]  doxycycline (VIBRAMYCIN) 100 MG capsule Take 1 capsule (100 mg total) by mouth 2 (two) times daily. One po bid x 7 days Patient not taking: Reported on 03/05/2015 02/15/15   Domenic Moras, PA-C  HYDROcodone-acetaminophen (NORCO/VICODIN) 5-325 MG per tablet Take 1 tablet by mouth every 6 (six) hours as needed for moderate pain. Patient not taking: Reported on 09/24/2018 02/17/15   Domenic Moras, PA-C  HYDROcodone-acetaminophen (NORCO/VICODIN) 5-325 MG tablet Take 1 tablet by mouth every 4 (four) hours as needed. Patient not taking: Reported on 09/24/2018 08/12/15   Sam, Olivia Canter, PA-C  ibuprofen (ADVIL,MOTRIN) 800 MG tablet Take 1 tablet (800 mg total) by mouth 3 (three) times daily. Patient not taking: Reported on 09/24/2018 03/12/16   Kalman Drape, PA  levETIRAcetam (KEPPRA) 1000 MG tablet Take 1 tablet (1,000 mg total) by mouth 2 (two) times daily. Patient not taking: Reported on 03/05/2015 05/09/14   Sherwood Gambler, MD  naproxen (NAPROSYN) 500 MG tablet Take 1 tablet (500 mg total)  by mouth 2 (two) times daily. Patient not taking: Reported on 09/24/2018 08/12/15   Sam, Olivia Canter, PA-C  oxyCODONE-acetaminophen (PERCOCET/ROXICET) 5-325 MG tablet Take 1 tablet by mouth every 4 (four) hours as needed for severe pain. 09/24/18   Tegeler, Gwenyth Allegra, MD  predniSONE (DELTASONE) 20 MG tablet Take 1 tablet (20 mg total) by mouth 2 (two) times daily. Patient not taking: Reported on 09/24/2018 01/02/15   Daleen Bo, MD  Vitamin D, Ergocalciferol, (DRISDOL) 1.25 MG (50000 UT) CAPS capsule Take 50,000 Units by mouth every 7 (seven) days.     [provider]    Allergies    Bee venom, Blueberry flavor, Other, Pineapple, Raspberry, and Depakote [divalproex sodium]  Review of Systems   Review of Systems  Constitutional: Negative for chills and fever.  HENT: Negative for congestion and sore throat.   Respiratory: Negative for shortness of breath.   Cardiovascular: Negative for chest pain.  Gastrointestinal: Negative for abdominal pain, diarrhea and vomiting.  Genitourinary: Negative for enuresis.  Musculoskeletal: Negative for back pain.       Left forearm pain.  Skin: Negative for rash.  Neurological: Negative for dizziness and headaches.  Hematological: Does not bruise/bleed easily.    Physical Exam Updated Vital Signs BP 135/66   Pulse 64   Temp 98 F (36.7 C) (Oral)   Resp 16   SpO2 100%   Physical Exam Vitals and nursing note reviewed.  Constitutional:      General: She is not in acute distress.    Appearance: She is not ill-appearing.  HENT:     Head: Normocephalic and atraumatic.     Nose: No congestion.  Eyes:     Conjunctiva/sclera: Conjunctivae normal.  Cardiovascular:     Rate and Rhythm: Normal rate and regular rhythm.     Pulses: Normal pulses.  Pulmonary:     Effort: Pulmonary effort is normal.  Musculoskeletal:        General: Tenderness present. No swelling or deformity.     Comments: Patient's left arm was visualized, there is no noted erythema, edema, ecchymosis or abrasions present.  She had full range of motion of her fingers, decreased range of motion of both flexion, extension at the wrist as well as her elbow.  Patient was tender to palpation along  her medial condyle. Neurovascular fully intact.  Skin:    General: Skin is warm and dry.  Neurological:     Mental Status: She is alert.  Psychiatric:        Mood and Affect: Mood normal.     ED Results / Procedures / Treatments   Labs (all labs ordered are listed, but only abnormal results are displayed) Labs Reviewed  - No data to display  EKG None  Radiology DG Elbow Complete Left  Result Date: 07/20/2020 CLINICAL DATA:  Lifting injury. Patient felt a pop and has forearm pain. EXAM: LEFT ELBOW - COMPLETE 3+ VIEW; LEFT FOREARM - 2 VIEW COMPARISON:  None. FINDINGS: The mineralization and alignment are normal. There is no evidence of acute fracture or dislocation. There is mild spurring of the coronoid process. No evidence of elbow joint effusion, foreign body or soft tissue emphysema. Mild ulnar minus variance at the wrist. IMPRESSION: No acute osseous findings. Mild spurring of the coronoid process. Electronically Signed   By: Richardean Sale M.D.   On: 07/20/2020 14:23   DG Forearm Left  Result Date: 07/20/2020 CLINICAL DATA:  Lifting injury. Patient felt a pop and has forearm  pain. EXAM: LEFT ELBOW - COMPLETE 3+ VIEW; LEFT FOREARM - 2 VIEW COMPARISON:  None. FINDINGS: The mineralization and alignment are normal. There is no evidence of acute fracture or dislocation. There is mild spurring of the coronoid process. No evidence of elbow joint effusion, foreign body or soft tissue emphysema. Mild ulnar minus variance at the wrist. IMPRESSION: No acute osseous findings. Mild spurring of the coronoid process. Electronically Signed   By: Richardean Sale M.D.   On: 07/20/2020 14:23    Procedures Procedures   Medications Ordered in ED Medications - No data to display  ED Course  I have reviewed the triage vital signs and the nursing notes.  Pertinent labs & imaging results that were available during my care of the patient were reviewed by me and considered in my medical decision making (see chart for details).    MDM Rules/Calculators/A&P                          Initial impression-patient presents with left forearm pain x2 days.  She is alert, does not appear in acute distress, vital signs reassuring.  Will obtain imaging for further evaluation.  Work-up-x-ray of elbow and forearm are negative for acute  findings.  Rule out-I have low suspicion for septic arthritis as patient denies IV drug use, skin exam was performed no erythematous, edematous, warm joints noted on exam. Low suspicion for fracture or dislocation as x-ray does not feel any significant findings. low suspicion for ligament or tendon damage as area was palpated no gross defects noted.  Patient decreased range of motion at her wrist and elbow by suspect this is secondary due to pain.  Low suspicion for compartment syndrome as area was palpated it was soft to the touch, neurovascular fully intact.   Plan-I suspect patient suffering from possible golfers elbow, will provide patient with short course of steroids, recommend over-the-counter pain medication follow-up with orthopedics for further evaluation.  Vital signs have remained stable, no indication for hospital admission. Patient given at home care as well strict return precautions.  Patient verbalized that they understood agreed to said plan.   Final Clinical Impression(s) / ED Diagnoses Final diagnoses:  Left arm pain    Rx / DC Orders ED Discharge Orders         Ordered    predniSONE (DELTASONE) 20 MG tablet  Daily        07/20/20 1639           Aron Baba 07/20/20 1811    Lucrezia Starch, MD 07/20/20 [phone]

## 2020-07-20 NOTE — ED Triage Notes (Signed)
Pt reports hurting her L arm while at work yesterday. States now she has numbness and severe pain from the elbow down on the L arm. States pain does shoot up the arm to her shoulder.

## 2020-07-20 NOTE — Discharge Instructions (Addendum)
You have been seen here for left elbow pain.  Start you on steroids please take as prescribed.  I recommend taking over-the-counter pain medications like ibuprofen and/or Tylenol every 6 as needed.  Please follow dosage and on the back of bottle.  I also recommend applying heat to the area and stretching out the muscles as this will help decrease stiffness and pain.  I have given you information on exercises please follow.  Like to follow-up with orthopedic surgery for further evaluation.  Come back to the emergency department if you develop chest pain, shortness of breath, severe abdominal pain, uncontrolled nausea, vomiting, diarrhea.

## 2020-07-20 NOTE — ED Notes (Signed)
Patient verbalizes understanding of discharge instructions. Opportunity for questioning and answers were provided. Armband removed by staff, pt discharged from ED.  

## 2020-07-20 NOTE — Progress Notes (Signed)
Patient did not show for appointment.   

## 2020-07-20 NOTE — Progress Notes (Signed)
Orthopedic Tech Progress Note Patient Details:  Stacy May June 07, 1980 [id number]  Ortho Devices Type of Ortho Device: Shoulder immobilizer Ortho Device/Splint Location: LUE Ortho Device/Splint Interventions: Ordered,Application,Adjustment   Post Interventions Patient Tolerated: Well Instructions Provided: Care of [place] 07/20/2020, 4:47 PM

## 2020-07-30 ENCOUNTER — Ambulatory Visit: Payer: Self-pay | Admitting: Orthopaedic Surgery

## 2020-08-03 ENCOUNTER — Encounter (HOSPITAL_COMMUNITY): Payer: Self-pay

## 2020-08-03 ENCOUNTER — Emergency Department (HOSPITAL_COMMUNITY)
Admission: EM | Admit: 2020-08-03 | Discharge: 2020-08-04 | Disposition: A | Discharge status: Home / Self Care | Payer: Self-pay | Attending: Emergency Medicine | Admitting: Emergency Medicine

## 2020-08-03 ENCOUNTER — Other Ambulatory Visit: Payer: Self-pay

## 2020-08-03 DIAGNOSIS — Z85841 Personal history of malignant neoplasm of brain: Secondary | ICD-10-CM | POA: Insufficient documentation

## 2020-08-03 DIAGNOSIS — F17210 Nicotine dependence, cigarettes, uncomplicated: Secondary | ICD-10-CM | POA: Insufficient documentation

## 2020-08-03 DIAGNOSIS — Z20822 Contact with and (suspected) exposure to covid-19: Secondary | ICD-10-CM | POA: Insufficient documentation

## 2020-08-03 DIAGNOSIS — Z7982 Long term (current) use of aspirin: Secondary | ICD-10-CM | POA: Insufficient documentation

## 2020-08-03 DIAGNOSIS — J45909 Unspecified asthma, uncomplicated: Secondary | ICD-10-CM | POA: Insufficient documentation

## 2020-08-03 DIAGNOSIS — R590 Localized enlarged lymph nodes: Secondary | ICD-10-CM | POA: Insufficient documentation

## 2020-08-03 DIAGNOSIS — J020 Streptococcal pharyngitis: Secondary | ICD-10-CM | POA: Insufficient documentation

## 2020-08-03 LAB — GROUP A STREP BY PCR: Group A Strep by PCR: DETECTED — AB

## 2020-08-03 MED ORDER — DEXAMETHASONE 1 MG/ML PO CONC
10.0000 mg | Freq: Once | ORAL | Status: AC
  Administered 2020-08-04: 10 mg via ORAL
  Filled 2020-08-03: qty 10

## 2020-08-03 MED ORDER — PENICILLIN G BENZATHINE 1200000 UNIT/2ML IM SUSP
1.2000 10*6.[IU] | Freq: Once | INTRAMUSCULAR | Status: AC
  Administered 2020-08-04: 1.2 10*6.[IU] via INTRAMUSCULAR
  Filled 2020-08-03: qty 2

## 2020-08-03 MED ORDER — HYDROCODONE-ACETAMINOPHEN 7.5-325 MG/15ML PO SOLN
10.0000 mL | Freq: Once | ORAL | Status: AC
  Administered 2020-08-04: 10 mL via ORAL
  Filled 2020-08-03: qty 15

## 2020-08-03 NOTE — ED Triage Notes (Signed)
Patient reports sore throat x 2 days, one episode of fever (101). Also states she had one bout of diarrhea.

## 2020-08-03 NOTE — ED Provider Notes (Signed)
[facility] EMERGENCY DEPARTMENT Provider Note   CSN: [id number] Arrival date & time: 08/03/20  1958     History Chief Complaint  Patient presents with  . Sore Throat    Stacy May is a 40 y.o. female.  Patient to ED with sore throat, fever, nasal congestion for the past 2 days. She reports her throat is significantly painful, causing difficulty swallowing even liquids. No vomiting, rash, headache.   The history is provided by the patient. No language interpreter was used.  Sore Throat Pertinent negatives include no abdominal pain, no headaches and no shortness of breath.       Past Medical History:  Diagnosis Date  . Asthma   . Bipolar 1 disorder ([place])   . Brain tumor ([place])   . PTSD (post-traumatic stress disorder)   . Schizoaffective disorder ([place])   . Seizures ([place])     There are no problems to display for this patient.   History reviewed. No pertinent surgical history.   OB History   No obstetric history on file.     History reviewed. No pertinent family history.  Social History   Tobacco Use  . Smoking status: Current Every Day Smoker    Packs/day: 0.50    Types: Cigarettes  . Smokeless tobacco: Never Used  Vaping Use  . Vaping Use: Never used  Substance Use Topics  . Alcohol use: No  . Drug use: Not Currently    Home Medications Prior to Admission medications   Medication Sig Start Date End Date Taking? Authorizing Provider  aspirin-acetaminophen-caffeine (EXCEDRIN MIGRAINE) [phone] MG tablet Take 2 tablets by mouth every 6 (six) hours as needed for headache.    [provider]  Aspirin-Salicylamide-Caffeine (BC HEADACHE POWDER PO) Take 1 packet by mouth as needed (headache/pain).    [provider]  doxycycline (VIBRAMYCIN) 100 MG capsule Take 1 capsule (100 mg total) by mouth 2 (two) times daily. One po bid x 7 days Patient not taking: Reported on 03/05/2015 02/15/15   Domenic Moras, PA-C   HYDROcodone-acetaminophen (NORCO/VICODIN) 5-325 MG per tablet Take 1 tablet by mouth every 6 (six) hours as needed for moderate pain. Patient not taking: Reported on 09/24/2018 02/17/15   Domenic Moras, PA-C  HYDROcodone-acetaminophen (NORCO/VICODIN) 5-325 MG tablet Take 1 tablet by mouth every 4 (four) hours as needed. Patient not taking: Reported on 09/24/2018 08/12/15   Sam, Olivia Canter, PA-C  ibuprofen (ADVIL,MOTRIN) 800 MG tablet Take 1 tablet (800 mg total) by mouth 3 (three) times daily. Patient not taking: Reported on 09/24/2018 03/12/16   Kalman Drape, PA  levETIRAcetam (KEPPRA) 1000 MG tablet Take 1 tablet (1,000 mg total) by mouth 2 (two) times daily. Patient not taking: Reported on 03/05/2015 05/09/14   Sherwood Gambler, MD  naproxen (NAPROSYN) 500 MG tablet Take 1 tablet (500 mg total) by mouth 2 (two) times daily. Patient not taking: Reported on 09/24/2018 08/12/15   Sam, Olivia Canter, PA-C  oxyCODONE-acetaminophen (PERCOCET/ROXICET) 5-325 MG tablet Take 1 tablet by mouth every 4 (four) hours as needed for severe pain. 09/24/18   Tegeler, Gwenyth Allegra, MD  predniSONE (DELTASONE) 20 MG tablet Take 1 tablet (20 mg total) by mouth 2 (two) times daily. Patient not taking: Reported on 09/24/2018 01/02/15   Daleen Bo, MD  Vitamin D, Ergocalciferol, (DRISDOL) 1.25 MG (50000 UT) CAPS capsule Take 50,000 Units by mouth every 7 (seven) days.    [provider]    Allergies    Bee venom, Blueberry  flavor, Other, Pineapple, Raspberry, and Depakote [divalproex sodium]  Review of Systems   Review of Systems  Constitutional: Positive for fever.  HENT: Positive for congestion, sore throat, trouble swallowing and voice change.   Respiratory: Negative for shortness of breath.   Gastrointestinal: Negative for abdominal pain and vomiting.  Skin: Negative for rash.  Neurological: Negative for weakness and headaches.    Physical Exam Updated Vital Signs BP 117/72 (BP Location: Left Arm)   Pulse 84    Temp 99 F (37.2 C) (Oral)   Resp 16   Ht 5' 4.5" (1.638 m)   Wt 88.5 kg   SpO2 100%   BMI 32.95 kg/m   Physical Exam Vitals and nursing note reviewed.  Constitutional:      General: She is not in acute distress.    Appearance: She is well-developed. She is ill-appearing. She is not toxic-appearing.  HENT:     Mouth/Throat:     Mouth: Mucous membranes are moist.     Comments: Significant swelling of tonsils bilaterally. Uvula midline. No peritonsillar abscess visualized. Minimal exudative process.  Cardiovascular:     Rate and Rhythm: Normal rate.  Pulmonary:     Effort: Pulmonary effort is normal.  Musculoskeletal:     Cervical back: Normal range of motion and neck supple.  Lymphadenopathy:     Cervical: Cervical adenopathy present.  Skin:    General: Skin is warm and dry.  Neurological:     Mental Status: She is alert.     ED Results / Procedures / Treatments   Labs (all labs ordered are listed, but only abnormal results are displayed) Labs Reviewed  GROUP A STREP BY PCR - Abnormal; Notable for the following components:      Result Value   Group A Strep by PCR DETECTED (*)    All other components within normal limits  SARS CORONAVIRUS 2 (TAT 6-24 HRS)    EKG None  Radiology No results found.  Procedures Procedures   Medications Ordered in ED Medications  dexamethasone (DECADRON) 1 MG/ML solution 10 mg (has no administration in time range)  HYDROcodone-acetaminophen (HYCET) 7.5-325 mg/15 ml solution 10 mL (has no administration in time range)  penicillin g benzathine (BICILLIN LA) 1200000 UNIT/2ML injection 1.2 Million Units (has no administration in time range)    ED Course  I have reviewed the triage vital signs and the nursing notes.  Pertinent labs & imaging results that were available during my care of the patient were reviewed by me and considered in my medical decision making (see chart for details).    MDM Rules/Calculators/A&P                           Patient to ED with c/o severe ST causing difficult swallowing x 2 days. Some fever and congestion.   The patient is observed to be able to swallow her own secretions, but she prefers spitting into an emesis basin secondary to pain. Strep is positive. She opts for IM Bicillin. Will give something for pain and decadron for swelling.   Plan to re-evaluated after medications, PO challenge for more comfort swallowing.   On recheck, she is feeling less pain, easily drinking PO fluids. Stable for discharge.   Final Clinical Impression(s) / ED Diagnoses Final diagnoses:  None   1. Strep throat  Rx / DC Orders ED Discharge Orders    None       Charlann Lange, PA-C 08/04/20 0104  Delora Fuel, MD 79/15/04 [phone]

## 2020-08-04 LAB — SARS CORONAVIRUS 2 (TAT 6-24 HRS): SARS Coronavirus 2: NEGATIVE

## 2020-08-04 NOTE — Discharge Instructions (Addendum)
Push fluids - warm or cold, whichever offers more relief. Recommend salt water gargles, Tylenol and/or ibuprofen for pain.   Return to the ED with any new or worsening symptoms.

## 2020-08-04 NOTE — ED Notes (Signed)
Discharge instructions discussed with pt. Pt verbalized understanding. Pt stable and ambulatory. No signature pad available. 

## 2020-08-14 ENCOUNTER — Ambulatory Visit: Payer: Self-pay | Admitting: Orthopaedic Surgery

## 2020-08-22 ENCOUNTER — Encounter: Payer: Self-pay | Admitting: Family

## 2020-08-22 NOTE — Progress Notes (Signed)
Patient did not show for appointment.   

## 2020-12-09 ENCOUNTER — Emergency Department (HOSPITAL_COMMUNITY)
Admission: EM | Admit: 2020-12-09 | Discharge: 2020-12-10 | Disposition: A | Discharge status: Home / Self Care | Payer: Self-pay | Attending: Emergency Medicine | Admitting: Emergency Medicine

## 2020-12-09 ENCOUNTER — Encounter (HOSPITAL_COMMUNITY): Payer: Self-pay | Admitting: Emergency Medicine

## 2020-12-09 ENCOUNTER — Other Ambulatory Visit: Payer: Self-pay

## 2020-12-09 DIAGNOSIS — W19XXXA Unspecified fall, initial encounter: Secondary | ICD-10-CM

## 2020-12-09 DIAGNOSIS — W01198A Fall on same level from slipping, tripping and stumbling with subsequent striking against other object, initial encounter: Secondary | ICD-10-CM | POA: Insufficient documentation

## 2020-12-09 DIAGNOSIS — R1031 Right lower quadrant pain: Secondary | ICD-10-CM | POA: Insufficient documentation

## 2020-12-09 DIAGNOSIS — B9689 Other specified bacterial agents as the cause of diseases classified elsewhere: Secondary | ICD-10-CM | POA: Insufficient documentation

## 2020-12-09 DIAGNOSIS — R1030 Lower abdominal pain, unspecified: Secondary | ICD-10-CM

## 2020-12-09 DIAGNOSIS — J45909 Unspecified asthma, uncomplicated: Secondary | ICD-10-CM | POA: Insufficient documentation

## 2020-12-09 DIAGNOSIS — N760 Acute vaginitis: Secondary | ICD-10-CM | POA: Insufficient documentation

## 2020-12-09 DIAGNOSIS — F17210 Nicotine dependence, cigarettes, uncomplicated: Secondary | ICD-10-CM | POA: Insufficient documentation

## 2020-12-09 LAB — POC URINE PREG, ED: Preg Test, Ur: NEGATIVE

## 2020-12-09 NOTE — ED Provider Notes (Signed)
Emergency Medicine Provider OB Triage Evaluation Note  Stacy May is a 40 y.o. female, No obstetric history on file., at Unknown gestation who presents to the emergency department with complaints of fall earlier tonight.  The patient slipped and fell from a standing earlier tonight when she was trying to kill a cockroach.  She fell onto her right side.  She hit her head on the side of a table.  No loss of consciousness, nausea, vomiting, numbness, weakness, visual changes.  She was able to stand and has been ambulatory since the fall.  She reports right flank and right lower quadrant abdominal pain that began after the fall.   Last menstrual cycle was July 1, but was only 3 days instead of 7.  She had a positive at home pregnancy test last Wednesday.  She denies vaginal bleeding, vaginal discharge, hematuria.  Review of  Systems  Positive: Abdominal pain, flank pain Negative: Syncope, nausea, vomiting, numbness, weakness, visual changes, vaginal bleeding, vaginal discharge, hematuria, fever, rash, wounds  Physical Exam  BP 119/84   Pulse 69   Temp 98.4 F (36.9 C) (Oral)   Resp 14   LMP 11/23/2020   SpO2 100%  General: Awake, no distress  HEENT: Atraumatic  Resp: Normal effort  Cardiac: Normal rate Abd: Nondistended, minimal tenderness to palpation in the right lower quadrant without rebound or guarding. MSK: Moves all extremities without difficulty Neuro: Speech clear  Medical Decision Making  Pt evaluated for pregnancy concern and is stable for transfer to [facility]. Pt is in agreement with plan for transfer.  11:45 PM - Pregnancy test is negative.   Clinical Impression   1. Fall, initial encounter   2. Lower abdominal pain   3. Bacterial vaginosis     40 year old female who had a positive at home pregnancy test 5 days ago presents after a mechanical fall from standing onto her right side.  She hit her head, but had no loss of consciousness.   Pregnancy test is negative.  Patient will require further evaluation and work up in the ED.    Joanne Gavel, PA-C 12/10/20 0713    Fatima Blank, MD 12/10/20 2104

## 2020-12-09 NOTE — ED Triage Notes (Signed)
Patient arrived with EMS from home , slipped and fell this evening and landed on her right side , no LOC /ambulatory , reports pain at RLQ , no vaginal bleeding , tested positive for pregnancy using home test kit this week . Evaluated by PA at triage.

## 2020-12-10 ENCOUNTER — Emergency Department (HOSPITAL_COMMUNITY): Payer: Self-pay

## 2020-12-10 LAB — BASIC METABOLIC PANEL
Anion gap: 7 (ref 5–15)
BUN: 5 mg/dL — ABNORMAL LOW (ref 6–20)
CO2: 25 mmol/L (ref 22–32)
Calcium: 9.2 mg/dL (ref 8.9–10.3)
Chloride: 104 mmol/L (ref 98–111)
Creatinine, Ser: 0.65 mg/dL (ref 0.44–1.00)
GFR, Estimated: >60 mL/min (ref >60)
Glucose, Bld: 94 mg/dL (ref 70–99)
Potassium: 3.8 mmol/L (ref 3.5–5.1)
Sodium: 136 mmol/L (ref 135–145)

## 2020-12-10 LAB — CBC WITH DIFFERENTIAL/PLATELET
Abs Immature Granulocytes: 0.02 10*3/uL (ref 0.00–0.07)
Basophils Absolute: 0.1 10*3/uL (ref 0.0–0.1)
Basophils Relative: 1 %
Eosinophils Absolute: 0.2 10*3/uL (ref 0.0–0.5)
Eosinophils Relative: 3 %
HCT: 37.2 % (ref 36.0–46.0)
Hemoglobin: 12.2 g/dL (ref 12.0–15.0)
Immature Granulocytes: 0 %
Lymphocytes Relative: 39 %
Lymphs Abs: 2.7 10*3/uL (ref 0.7–4.0)
MCH: 29.5 pg (ref 26.0–34.0)
MCHC: 32.8 g/dL (ref 30.0–36.0)
MCV: 90.1 fL (ref 80.0–100.0)
Monocytes Absolute: 0.7 10*3/uL (ref 0.1–1.0)
Monocytes Relative: 10 %
Neutro Abs: 3.3 10*3/uL (ref 1.7–7.7)
Neutrophils Relative %: 47 %
Platelets: 452 10*3/uL — ABNORMAL HIGH (ref 150–400)
RBC: 4.13 MIL/uL (ref 3.87–5.11)
RDW: 14.9 % (ref 11.5–15.5)
WBC: 7 10*3/uL (ref 4.0–10.5)
nRBC: 0 % (ref 0.0–0.2)

## 2020-12-10 LAB — GC/CHLAMYDIA PROBE AMP (~~LOC~~) NOT AT ARMC
Chlamydia: NEGATIVE
Comment: NEGATIVE
Comment: NORMAL
Neisseria Gonorrhea: NEGATIVE

## 2020-12-10 LAB — URINALYSIS, ROUTINE W REFLEX MICROSCOPIC
Bilirubin Urine: NEGATIVE
Glucose, UA: NEGATIVE mg/dL
Hgb urine dipstick: NEGATIVE
Ketones, ur: NEGATIVE mg/dL
Leukocytes,Ua: NEGATIVE
Nitrite: NEGATIVE
Protein, ur: NEGATIVE mg/dL
Specific Gravity, Urine: 1.03 (ref 1.005–1.030)
pH: 5 (ref 5.0–8.0)

## 2020-12-10 LAB — WET PREP, GENITAL
Sperm: NONE SEEN
Trich, Wet Prep: NONE SEEN
Yeast Wet Prep HPF POC: NONE SEEN

## 2020-12-10 LAB — HCG, QUANTITATIVE, PREGNANCY: hCG, Beta Chain, Quant, S: 1 m[IU]/mL (ref <5)

## 2020-12-10 MED ORDER — METRONIDAZOLE 500 MG PO TABS: 500.0000 mg | ORAL_TABLET | Freq: Two times a day (BID) | ORAL | 0 refills | Status: AC

## 2020-12-10 NOTE — ED Provider Notes (Signed)
[facility] EMERGENCY DEPARTMENT Provider Note  CSN: [id number] Arrival date & time: 12/09/20 2258  Chief Complaint(s) Fall/RLQ pain [place]  HPI Stacy May is a 40 y.o. female here after a mechanical fall while trying to squash a cockroach. She slipped and fell onto her right left side. Reports RLQ pain that started after the fall. Denies other injuries from fall.  RLQ pain is mild to moderate aching. Worse with movement and palpation. Alleviated by being still. No pain radiation. No vaginal bleeding. Reports that she took a home pregnancy earlier in the day and was positive. Reports being sexually active recently. Denies vaginal discharge, but reports change in odor.  HPI  Past Medical History Past Medical History:  Diagnosis Date   Asthma    Bipolar 1 disorder ([place])    Brain tumor ([place])    PTSD (post-traumatic stress disorder)    Schizoaffective disorder ([place])    Seizures ([place])    There are no problems to display for this patient.  Home Medication(s) Prior to Admission medications   Medication Sig Start Date End Date Taking? Authorizing Provider  metroNIDAZOLE (FLAGYL) 500 MG tablet Take 1 tablet (500 mg total) by mouth 2 (two) times daily for 7 days. 12/10/20 12/17/20 Yes Julianah Marciel, Grayce Sessions, MD  aspirin-acetaminophen-caffeine (EXCEDRIN MIGRAINE) [phone] MG tablet Take 2 tablets by mouth every 6 (six) hours as needed for headache.    [provider]  Aspirin-Salicylamide-Caffeine (BC HEADACHE POWDER PO) Take 1 packet by mouth as needed (headache/pain).    [provider]  doxycycline (VIBRAMYCIN) 100 MG capsule Take 1 capsule (100 mg total) by mouth 2 (two) times daily. One po bid x 7 days Patient not taking: Reported on 03/05/2015 02/15/15   Domenic Moras, PA-C  HYDROcodone-acetaminophen (NORCO/VICODIN) 5-325 MG per tablet Take 1 tablet by mouth every 6 (six) hours as needed for moderate pain. Patient not taking: Reported on  09/24/2018 02/17/15   Domenic Moras, PA-C  HYDROcodone-acetaminophen (NORCO/VICODIN) 5-325 MG tablet Take 1 tablet by mouth every 4 (four) hours as needed. Patient not taking: Reported on 09/24/2018 08/12/15   Sam, Olivia Canter, PA-C  ibuprofen (ADVIL,MOTRIN) 800 MG tablet Take 1 tablet (800 mg total) by mouth 3 (three) times daily. Patient not taking: Reported on 09/24/2018 03/12/16   Kalman Drape, PA  levETIRAcetam (KEPPRA) 1000 MG tablet Take 1 tablet (1,000 mg total) by mouth 2 (two) times daily. Patient not taking: Reported on 03/05/2015 05/09/14   Sherwood Gambler, MD  naproxen (NAPROSYN) 500 MG tablet Take 1 tablet (500 mg total) by mouth 2 (two) times daily. Patient not taking: Reported on 09/24/2018 08/12/15   Sam, Olivia Canter, PA-C  oxyCODONE-acetaminophen (PERCOCET/ROXICET) 5-325 MG tablet Take 1 tablet by mouth every 4 (four) hours as needed for severe pain. 09/24/18   Tegeler, Gwenyth Allegra, MD  predniSONE (DELTASONE) 20 MG tablet Take 1 tablet (20 mg total) by mouth 2 (two) times daily. Patient not taking: Reported on 09/24/2018 01/02/15   Daleen Bo, MD  Vitamin D, Ergocalciferol, (DRISDOL) 1.25 MG (50000 UT) CAPS capsule Take 50,000 Units by mouth every 7 (seven) days.    [provider]  Past Surgical History History reviewed. No pertinent surgical history. Family History No family history on file.  Social History Social History   Tobacco Use   Smoking status: Every Day    Packs/day: 0.50    Types: Cigarettes   Smokeless tobacco: Never  Vaping Use   Vaping Use: Never used  Substance Use Topics   Alcohol use: No   Drug use: Not Currently   Allergies Bee venom, Blueberry flavor, Other, Pineapple, Raspberry, and Depakote [divalproex sodium]  Review of Systems Review of Systems All other systems are reviewed and are negative for acute change except  as noted in the HPI  Physical Exam Vital Signs  I have reviewed the triage vital signs BP 119/84   Pulse 69   Temp 98.4 F (36.9 C) (Oral)   Resp 14   LMP 11/23/2020   SpO2 100%   Physical Exam Constitutional:      General: She is not in acute distress.    Appearance: She is well-developed. She is not diaphoretic.  HENT:     Head: Normocephalic and atraumatic.     Right Ear: External ear normal.     Left Ear: External ear normal.     Nose: Nose normal.  Eyes:     General: No scleral icterus.       Right eye: No discharge.        Left eye: No discharge.     Conjunctiva/sclera: Conjunctivae normal.     Pupils: Pupils are equal, round, and reactive to light.  Cardiovascular:     Rate and Rhythm: Normal rate and regular rhythm.     Pulses:          Radial pulses are 2+ on the right side and 2+ on the left side.       Dorsalis pedis pulses are 2+ on the right side and 2+ on the left side.     Heart sounds: Normal heart sounds. No murmur heard.   No friction rub. No gallop.  Pulmonary:     Effort: Pulmonary effort is normal. No respiratory distress.     Breath sounds: Normal breath sounds. No stridor. No wheezing.  Abdominal:     General: There is no distension.     Palpations: Abdomen is soft.     Tenderness: There is abdominal tenderness (mild) in the right lower quadrant and suprapubic area. There is no guarding or rebound.  Musculoskeletal:        General: No tenderness.     Cervical back: Normal range of motion and neck supple. No bony tenderness.     Thoracic back: No bony tenderness.     Lumbar back: No bony tenderness.     Comments: Clavicles stable. Chest stable to AP/Lat compression. Pelvis stable to Lat compression. No obvious extremity deformity. No chest or abdominal wall contusion.  Skin:    General: Skin is warm and dry.     Findings: No erythema or rash.  Neurological:     Mental Status: She is alert and oriented to person, place, and time.      Comments: Moving all extremities    ED Results and Treatments Labs (all labs ordered are listed, but only abnormal results are displayed) Labs Reviewed  WET PREP, GENITAL - Abnormal; Notable for the following components:      Result Value   Clue Cells Wet Prep HPF POC PRESENT (*)    WBC, Wet Prep HPF POC MODERATE (*)    All other components within  normal limits  CBC WITH DIFFERENTIAL/PLATELET - Abnormal; Notable for the following components:   Platelets 452 (*)    All other components within normal limits  BASIC METABOLIC PANEL - Abnormal; Notable for the following components:   BUN 5 (*)    All other components within normal limits  URINALYSIS, ROUTINE W REFLEX MICROSCOPIC  HCG, QUANTITATIVE, PREGNANCY  POC URINE PREG, ED  GC/CHLAMYDIA PROBE AMP ([facility]) NOT AT [facility]                                                                                                                         EKG  EKG Interpretation  Date/Time:    Ventricular Rate:    PR Interval:    QRS Duration:   QT Interval:    QTC Calculation:   R Axis:     Text Interpretation:         Radiology CT Renal Stone Study  Result Date: 12/10/2020 CLINICAL DATA:  40 year old female with flank pain. EXAM: CT ABDOMEN AND PELVIS WITHOUT CONTRAST TECHNIQUE: Multidetector CT imaging of the abdomen and pelvis was performed following the standard protocol without IV contrast. COMPARISON:  None. FINDINGS: Lower chest: Subtle subpleural scarring and/or emphysema in the right lower lung. Otherwise negative lung bases. No cardiomegaly. No pericardial or pleural effusion. Hepatobiliary: Negative noncontrast liver. Suggestion of small layering gallstones on series 3, image 26. No gallbladder inflammation is evident. No bile duct dilatation. Pancreas: Negative noncontrast pancreas. Spleen: Negative. Adrenals/Urinary Tract: Normal adrenal glands. Kidneys appear symmetric and nonobstructed. No nephrolithiasis. No perinephric  stranding. Both proximal ureters are decompressed. No periureteral stranding. Decompressed bladder in the pelvis. No urinary calculus identified. Stomach/Bowel: Redundant sigmoid colon tracking toward the epigastrium. Mild retained stool throughout the large bowel. No large bowel inflammation. Normal appendix on coronal image 40. Decompressed and negative terminal ileum. No dilated small bowel. Decompressed stomach and duodenum. No free air or free fluid. Vascular/Lymphatic: Normal caliber abdominal aorta. No calcified atherosclerosis. No lymphadenopathy identified. Reproductive: Evidence of a ventral intramural uterine fibroid estimated at 2.7 cm (sagittal image 55). Otherwise negative noncontrast appearance. Other: No pelvic free fluid. Musculoskeletal: Mild disc bulging at the lumbosacral junction. Circumscribed and benign appearing small sclerotic rimmed lucent area of the medial iliac bone on series 3, image 52. Small benign bone island also identified at the left ischium. No acute osseous abnormality identified. IMPRESSION: 1. No urinary calculus or obstructive uropathy. 2. Probable cholelithiasis. But no CT evidence of acute cholecystitis. If there is right abdominal pain then follow-up Right Upper Quadrant Ultrasound may be valuable. 3. Small uterine fibroid. 4. Early paraseptal emphysema changes in the right lower lung. Electronically Signed   By: Genevie Ann M.D.   On: 12/10/2020 04:50    Pertinent labs & imaging results that were available during my care of the patient were reviewed by me and considered in my medical decision making (see chart for details).  Medications Ordered in ED Medications - No data to display  Procedures Procedures  (including critical care time)  Medical Decision Making / ED Course I have reviewed the nursing notes for this encounter and the  patient's prior records (if available in EHR or on provided paperwork).   Karri CAMREE WIGINGTON was evaluated in Emergency Department on 12/10/2020 for the symptoms described in the history of present illness. She was evaluated in the context of the global COVID-19 pandemic, which necessitated consideration that the patient might be at risk for infection with the SARS-CoV-2 virus that causes COVID-19. Institutional protocols and algorithms that pertain to the evaluation of patients at risk for COVID-19 are in a state of rapid change based on information released by regulatory bodies including the CDC and federal and state organizations. These policies and algorithms were followed during the patient's care in the ED.  Work up negative for acute injuries. HCG negative. Did reveal evidence of BV. Will treat with flagyl. GC/Chlam sent via self swab. UA negative.        Final Clinical Impression(s) / ED Diagnoses Final diagnoses:  Fall, initial encounter  Lower abdominal pain  Bacterial vaginosis   The patient appears reasonably screened and/or stabilized for discharge and I doubt any other medical condition or other [facility] requiring further screening, evaluation, or treatment in the ED at this time prior to discharge. Safe for discharge with strict return precautions.  Disposition: Discharge  Condition: Good  I have discussed the results, Dx and Tx plan with the patient/family who expressed understanding and agree(s) with the plan. Discharge instructions discussed at length. The patient/family was given strict return precautions who verbalized understanding of the instructions. No further questions at time of discharge.    ED Discharge Orders          Ordered    metroNIDAZOLE (FLAGYL) 500 MG tablet  2 times daily        12/10/20 0540            Follow Up: Primary care provider  Call  as needed      This chart was dictated using voice recognition software.  Despite best efforts to  proofread,  errors can occur which can change the documentation meaning.    Fatima Blank, MD 12/10/20 2125

## 2023-02-01 IMAGING — CT CT RENAL STONE PROTOCOL
2 of 4 series · 16 of 46 positions shown, 18 images · non-contrast
Comparison: None.

CLINICAL DATA: 39-year-old female with flank pain.

EXAM:
CT ABDOMEN AND PELVIS WITHOUT CONTRAST
TECHNIQUE: Multidetector CT imaging of the abdomen and pelvis was performed
following the standard protocol without IV contrast.

[Series 3: ap without · axial · non-contrast · 0.91mm/px · z∈[+826,+1221]mm · 13 of 89 slices shown, 15 images]
[im 5/89  soft-tissue]
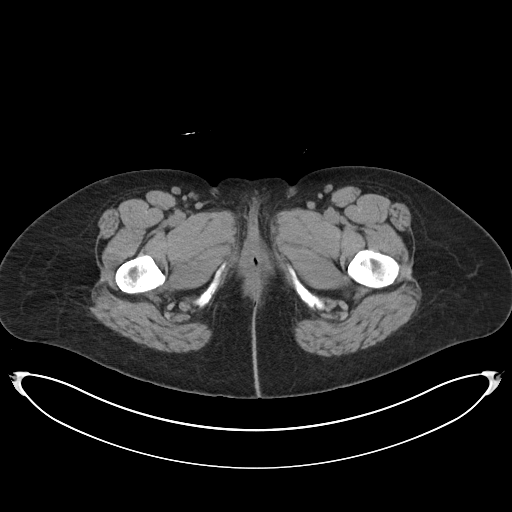
[im 5/89  bone]
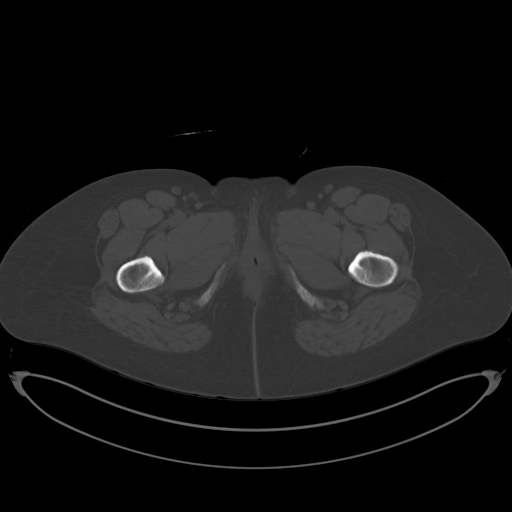
[im 14/89  soft-tissue]
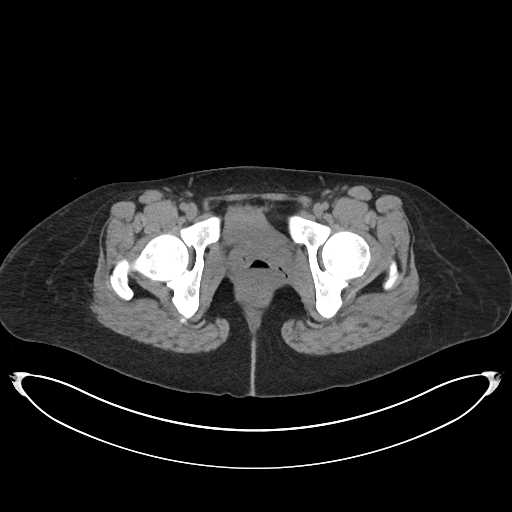
[im 19/89  soft-tissue]
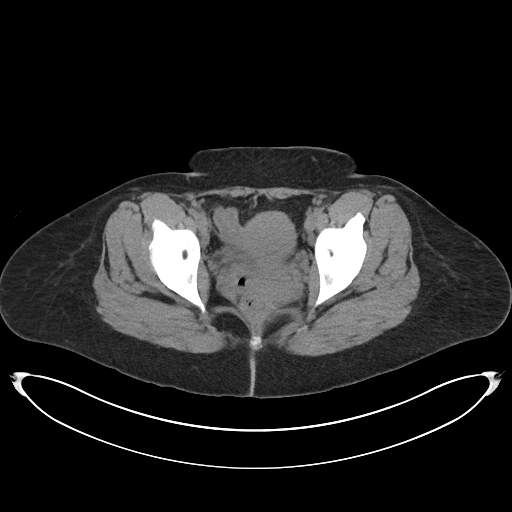
[im 24/89  soft-tissue]
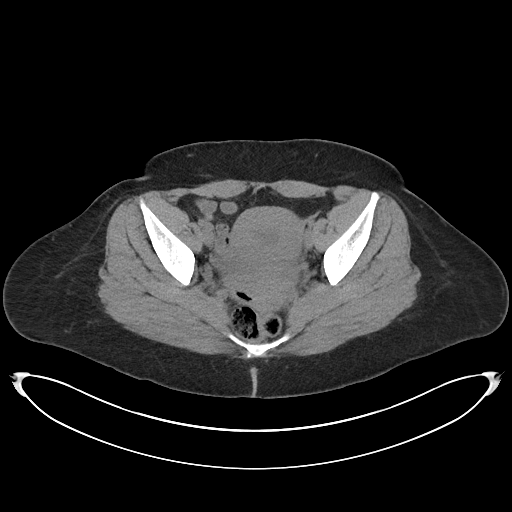
[im 33/89  soft-tissue]
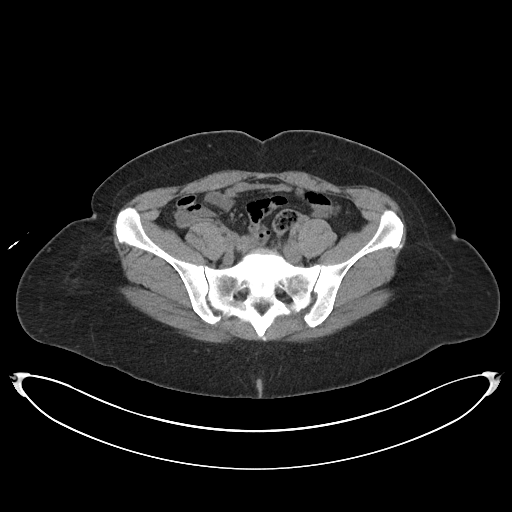
[im 38/89  soft-tissue]
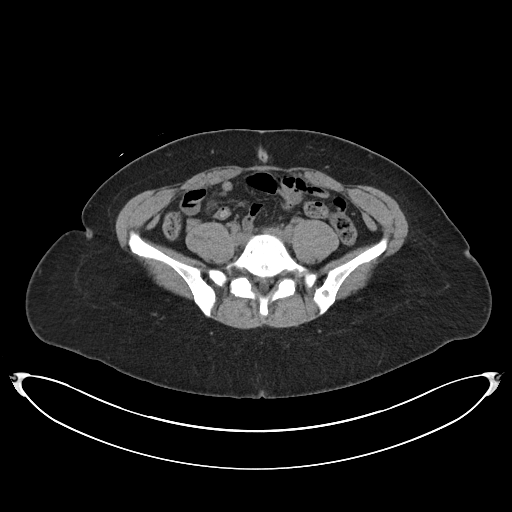
[im 47/89  soft-tissue]
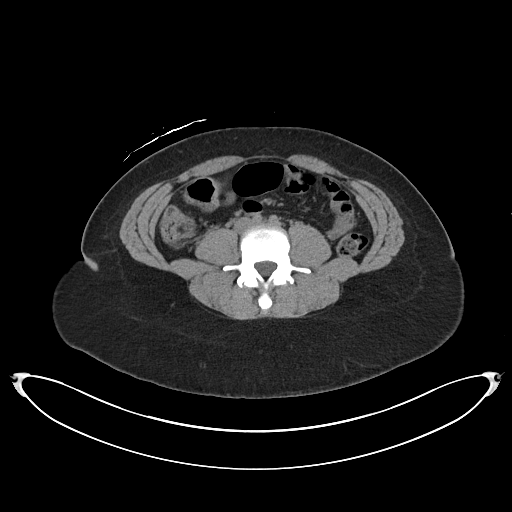
[im 51/89  soft-tissue]
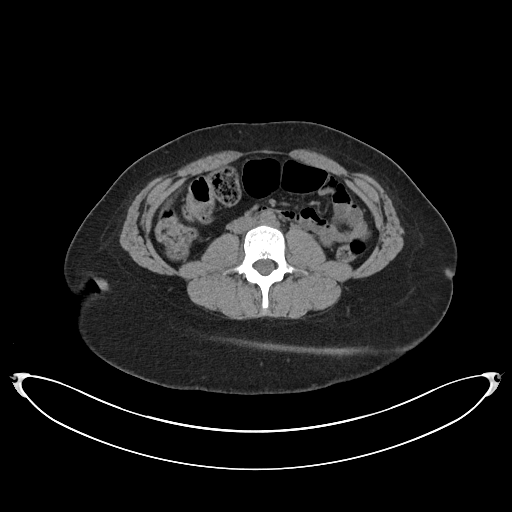
[im 56/89  soft-tissue]
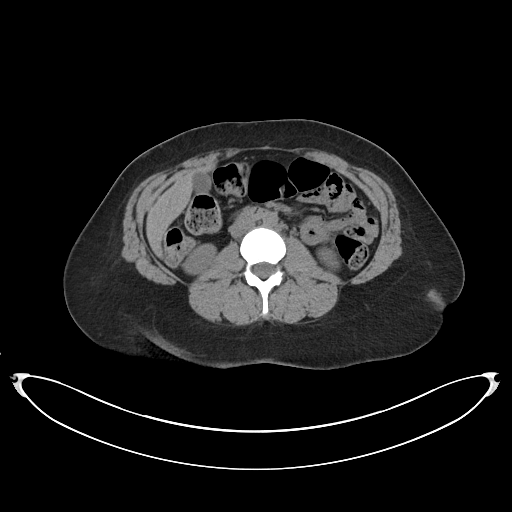
[im 56/89  bone]
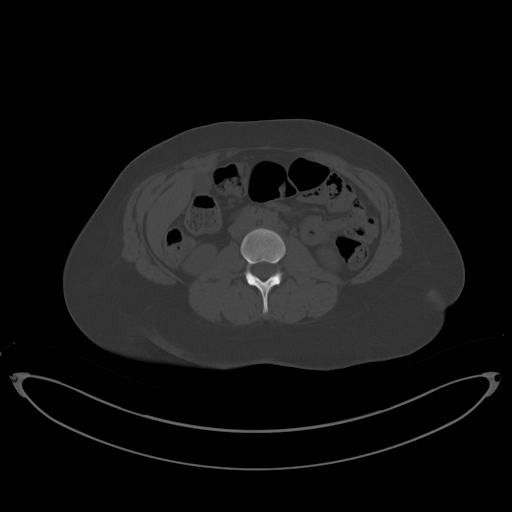
[im 65/89  soft-tissue]
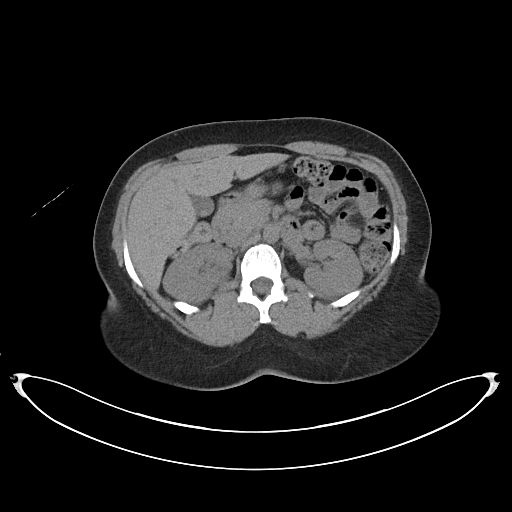
[im 70/89  soft-tissue]
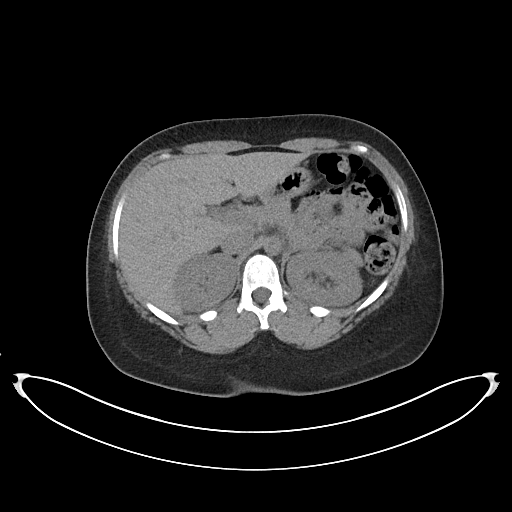
[im 75/89  soft-tissue]
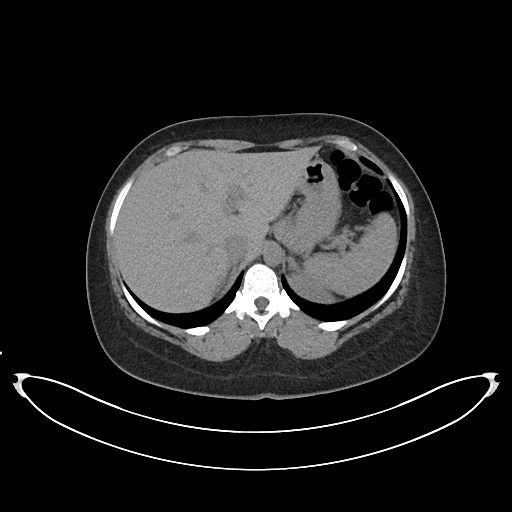
[im 84/89  soft-tissue]
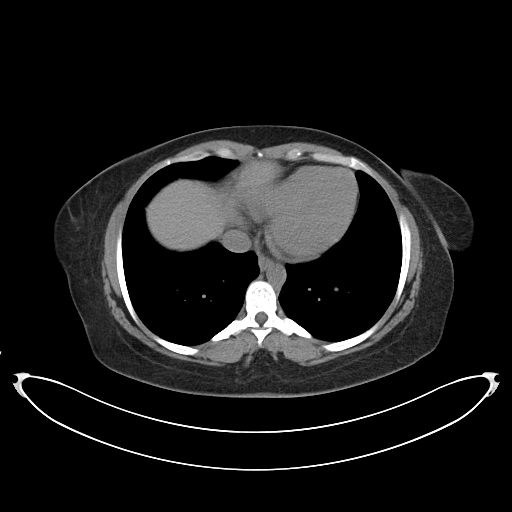

[Series 6: cor · coronal · 0.68mm/px · 3 of 88 slices shown]
[im 30/88  soft-tissue]
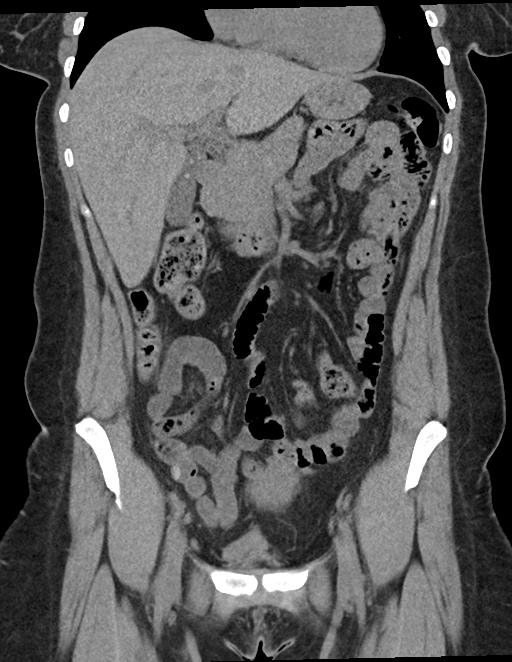
[im 39/88  soft-tissue]
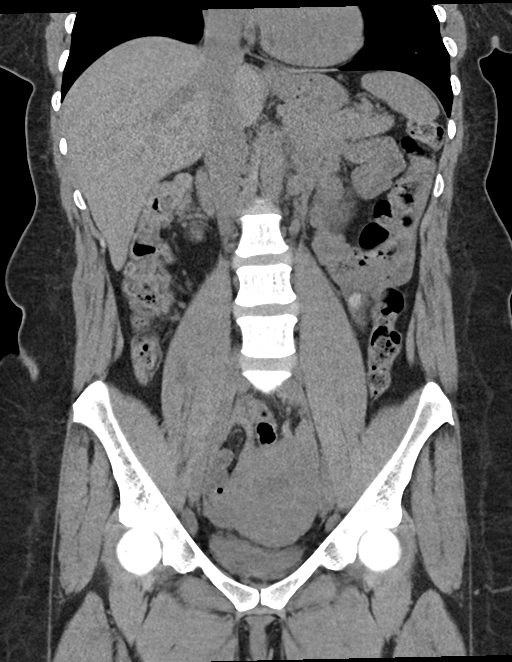
[im 49/88  soft-tissue]
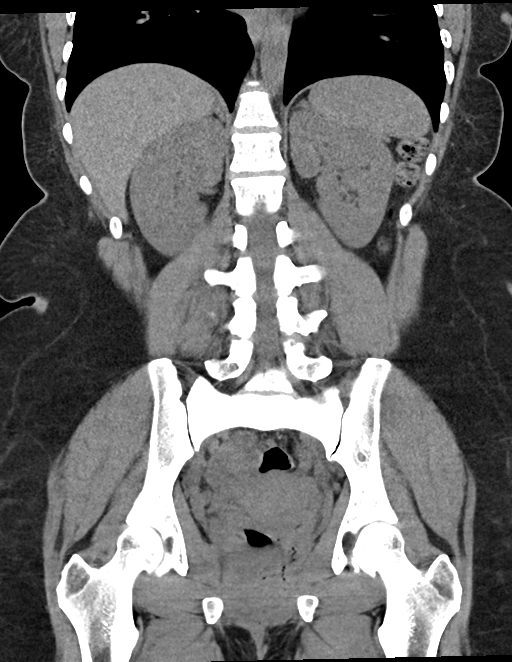

[16 of 46 positions shown; findings below may reference images not displayed]

FINDINGS: Lower chest: Subtle subpleural scarring and/or emphysema in the
right lower lung. Otherwise negative lung bases. No cardiomegaly. No
pericardial or pleural effusion.

Hepatobiliary: Negative noncontrast liver. Suggestion of small
layering gallstones on series 3, image 26. No gallbladder
inflammation is evident. No bile duct dilatation.

Pancreas: Negative noncontrast pancreas.

Spleen: Negative.

Adrenals/Urinary Tract: Normal adrenal glands.

Kidneys appear symmetric and nonobstructed. No nephrolithiasis. No
perinephric stranding. Both proximal ureters are decompressed. No
periureteral stranding. Decompressed bladder in the pelvis. No
urinary calculus identified.

Stomach/Bowel: Redundant sigmoid colon tracking toward the
epigastrium. Mild retained stool throughout the large bowel. No
large bowel inflammation. Normal appendix on coronal image 40.
Decompressed and negative terminal ileum. No dilated small bowel.
Decompressed stomach and duodenum. No free air or free fluid.

Vascular/Lymphatic: Normal caliber abdominal aorta. No calcified
atherosclerosis. No lymphadenopathy identified.

Reproductive: Evidence of a ventral intramural uterine fibroid
estimated at 2.7 cm (sagittal image 55). Otherwise negative
noncontrast appearance.

Other: No pelvic free fluid.

Musculoskeletal: Mild disc bulging at the lumbosacral junction.
Circumscribed and benign appearing small sclerotic rimmed lucent
area of the medial iliac bone on series 3, image 52. Small benign
bone island also identified at the left ischium. No acute osseous
abnormality identified.
IMPRESSION: 1. No urinary calculus or obstructive uropathy.
2. Probable cholelithiasis. But no CT evidence of acute
cholecystitis. If there is right abdominal pain then follow-up Right
Upper Quadrant Ultrasound may be valuable.
3. Small uterine fibroid.
4. Early paraseptal emphysema changes in the right lower lung.
